# Patient Record
Sex: Female | Born: 1990 | Race: White | Hispanic: No | Marital: Married | Smoking: Never smoker
Health system: Southern US, Community
[De-identification: ages and names within clinical notes are randomized; demographics above are authoritative.]

## PROBLEM LIST (undated history)

## (undated) DIAGNOSIS — O09299 Supervision of pregnancy with other poor reproductive or obstetric history, unspecified trimester: Secondary | ICD-10-CM

## (undated) DIAGNOSIS — Z889 Allergy status to unspecified drugs, medicaments and biological substances status: Secondary | ICD-10-CM

## (undated) DIAGNOSIS — N200 Calculus of kidney: Secondary | ICD-10-CM

## (undated) DIAGNOSIS — Z9109 Other allergy status, other than to drugs and biological substances: Secondary | ICD-10-CM

## (undated) DIAGNOSIS — J3081 Allergic rhinitis due to animal (cat) (dog) hair and dander: Secondary | ICD-10-CM

## (undated) DIAGNOSIS — K219 Gastro-esophageal reflux disease without esophagitis: Secondary | ICD-10-CM

## (undated) HISTORY — DX: Gastro-esophageal reflux disease without esophagitis: K21.9

## (undated) HISTORY — PX: OTHER SURGICAL HISTORY: SHX169

## (undated) HISTORY — DX: Other allergy status, other than to drugs and biological substances: Z91.09

## (undated) HISTORY — PX: TONSILLECTOMY AND ADENOIDECTOMY: SUR1326

## (undated) HISTORY — DX: Allergy status to unspecified drugs, medicaments and biological substances: Z88.9

## (undated) HISTORY — DX: Allergic rhinitis due to animal (cat) (dog) hair and dander: J30.81

---

## 2012-05-07 ENCOUNTER — Ambulatory Visit: Payer: Self-pay | Admitting: Women's Health

## 2012-05-07 ENCOUNTER — Encounter: Payer: Self-pay | Admitting: Women's Health

## 2012-05-07 ENCOUNTER — Ambulatory Visit (INDEPENDENT_AMBULATORY_CARE_PROVIDER_SITE_OTHER): Payer: BC Managed Care – PPO | Admitting: Women's Health

## 2012-05-07 VITALS — BP 100/62 | Ht 61.5 in | Wt 105.0 lb

## 2012-05-07 DIAGNOSIS — Z309 Encounter for contraceptive management, unspecified: Secondary | ICD-10-CM

## 2012-05-07 DIAGNOSIS — N949 Unspecified condition associated with female genital organs and menstrual cycle: Secondary | ICD-10-CM

## 2012-05-07 DIAGNOSIS — K219 Gastro-esophageal reflux disease without esophagitis: Secondary | ICD-10-CM

## 2012-05-07 DIAGNOSIS — IMO0001 Reserved for inherently not codable concepts without codable children: Secondary | ICD-10-CM | POA: Insufficient documentation

## 2012-05-07 DIAGNOSIS — N938 Other specified abnormal uterine and vaginal bleeding: Secondary | ICD-10-CM

## 2012-05-07 LAB — PREGNANCY, URINE: Preg Test, Ur: NEGATIVE

## 2012-05-07 MED ORDER — ETONOGESTREL-ETHINYL ESTRADIOL 0.12-0.015 MG/24HR VA RING: VAGINAL_RING | VAGINAL | Status: DC

## 2012-05-07 NOTE — Progress Notes (Signed)
Patient ID: Matha Masse, female   DOB: Jul 12, 1991, 20 y.o.   MRN: [id number] Presents as a new patient with a problem. History of regular monthly 4-5 day cycles. First dose of Depo-Provera in January and had bleeding/spotting daily since February 20. Did not receive the second injection of depo in April since spotting. Primary care physician instructed her to wait until cycle regulated and then start back on pills. Had nausea on pills, has used condoms since April. Same partner for years. U PT negative. Gardasil series completed. Records reviewed, normal Pap July 2012 prescribed Ortho Tri-Cyclen, stopped due to nausea, tried depo in Jan, 2013.  Exam: Heart regular rate and rhythm, thyroid not enlarged no palpable nodules. Lungs clear throughout. Abdomen soft, nontender, external genitalia within normal limits, speculum exam scant brown discharge wet prep negative, GC/Chlamydia culture taken and is pending. Bimanual no CMT or adnexal fullness or tenderness.  DUB probably from Depo-Provera  Plan:Nuva ring placed with instructions, prescription and samples given condoms especially first month and for infection control. Slight risk for blood clots strokes reviewed, instructed to call if continues with spotting or if problems. Reviewed if spotting would continue will do further blood work.

## 2012-05-07 NOTE — Patient Instructions (Signed)
Health Maintenance, 18- to 21-Year-Old SCHOOL PERFORMANCE After high school completion, the Tahje Borawski adult may be attending college, technical or vocational school, or entering the military or the work force. SOCIAL AND EMOTIONAL DEVELOPMENT The Aveen Stansel adult establishes adult relationships and explores sexual identity. Niki Cosman adults may be living at home or in a college dorm or apartment. Increasing independence is important with Savina Olshefski adults. Throughout adolescence, teens should assume responsibility of their own health care. IMMUNIZATIONS Most Kiele Heavrin adults should be fully vaccinated. A booster dose of Tdap (tetanus, diphtheria, and pertussis, or "whooping cough"), a dose of meningococcal vaccine to protect against a certain type of bacterial meningitis, hepatitis A, human papillomarvirus (HPV), chickenpox, or measles vaccines may be indicated, if not given at an earlier age. Annual influenza or "flu" vaccination should be considered during flu season.  TESTING Annual screening for vision and hearing problems is recommended. Vision should be screened objectively at least once between 18 and 21 years of age. The Rook Maue adult may be screened for anemia or tuberculosis. Trevante Tennell adults should have a blood test to check for high cholesterol during this time period. Sherise Geerdes adults should be screened for use of alcohol and drugs. If the Delaine Canter adult is sexually active, screening for sexually transmitted infections, pregnancy, or HIV may be performed. Screening for cervical cancer should be performed within 3 years of beginning sexual activity. NUTRITION AND ORAL HEALTH  Adequate calcium intake is important. Consume 3 servings of low-fat milk and dairy products daily. For those who do not drink milk or consume dairy products, calcium enriched foods, such as juice, bread, or cereal, dark, leafy greens, or canned fish are alternate sources of calcium.   Drink plenty of water. Limit fruit juice to 8 to 12 ounces per day.  Avoid sugary beverages or sodas.   Discourage skipping meals, especially breakfast. Teens should eat a good variety of vegetables and fruits, as well as lean meats.   Avoid high fat, high salt, and high sugar foods, such as candy, chips, and cookies.   Encourage Sondra Blixt adults to participate in meal planning and preparation.   Eat meals together as a family whenever possible. Encourage conversation at mealtime.   Limit fast food choices and eating out at restaurants.   Brush teeth twice a day and floss.   Schedule dental exams twice a year.  SLEEP Regular sleep habits are important. PHYSICAL, SOCIAL, AND EMOTIONAL DEVELOPMENT  One hour of regular physical activity daily is recommended. Continue to participate in sports.   Encourage Andrian Urbach adults to develop their own interests and consider community service or volunteerism.   Provide guidance to the Kaniesha Barile adult in making decisions about college and work plans.   Make sure that Chrys Landgrebe adults know that they should never be in a situation that makes them uncomfortable, and they should tell partners if they do not want to engage in sexual activity.   Talk to the Alexsis Kathman adult about body image. Eating disorders may be noted at this time. Abram Sax adults may also be concerned about being overweight. Monitor the Orland Visconti adult for weight gain or loss.   Mood disturbances, depression, anxiety, alcoholism, or attention problems may be noted in Divine Imber adults. Talk to the caregiver if there are concerns about mental illness.   Negotiate limit setting and independent decision making.   Encourage the Mattilyn Crites adult to handle conflict without physical violence.   Avoid loud noises which may impair hearing.   Limit television and computer time to 2 hours per day.   Individuals who engage in excessive sedentary activity are more likely to become overweight.  RISK BEHAVIORS  Sexually active Mabel Roll adults need to take precautions against pregnancy and sexually  transmitted infections. Talk to Tiger Spieker adults about contraception.   Provide a tobacco-free and drug-free environment for the Hetal Proano adult. Talk to the Tosh Glaze adult about drug, tobacco, and alcohol use among friends or at friends' homes. Make sure the Dilana Mcphie adult knows that smoking tobacco or marijuana and taking drugs have health consequences and may impact brain development.   Teach the Athene Schuhmacher adult about appropriate use of over-the-counter or prescription medicines.   Establish guidelines for driving and for riding with friends.   Talk to Ruby Dilone adults about the risks of drinking and driving or boating. Encourage the Della Scrivener adult to call you if he or she or friends have been drinking or using drugs.   Remind Ladasia Sircy adults to wear seat belts at all times in cars and life vests in boats.   Rodger Giangregorio adults should always wear a properly fitted helmet when they are riding a bicycle.   Use caution with all-terrain vehicles (ATVs) or other motorized vehicles.   Do not keep handguns in the home. (If you do, the gun and ammunition should be locked separately and out of the Kewan Mcnease adult's access.)   Equip your home with smoke detectors and change the batteries regularly. Make sure all family members know the fire escape plans for your home.   Teach Perry Brucato adults not to swim alone and not to dive in shallow water.   All individuals should wear sunscreen that protects against UVA and UVB light with at least a sun protection factor (SPF) of 30 when out in the sun. This minimizes sun burning.  WHAT'S NEXT? Yasaman Kolek adults should visit their pediatrician or family physician yearly. By Isobel Eisenhuth adulthood, health care should be transitioned to a family physician or internal medicine specialist. Sexually active females may want to begin annual physical exams with a gynecologist. Document Released: 02/09/2007 Document Revised: 11/03/2011 Document Reviewed: 03/01/2007 ExitCare Patient Information 2012 [employer]. 

## 2012-05-08 LAB — GC/CHLAMYDIA PROBE AMP, GENITAL
Chlamydia, DNA Probe: NEGATIVE
GC Probe Amp, Genital: NEGATIVE

## 2012-07-23 ENCOUNTER — Telehealth: Payer: Self-pay | Admitting: *Deleted

## 2012-07-23 DIAGNOSIS — IMO0001 Reserved for inherently not codable concepts without codable children: Secondary | ICD-10-CM

## 2012-07-23 MED ORDER — ETONOGESTREL-ETHINYL ESTRADIOL 0.12-0.015 MG/24HR VA RING: VAGINAL_RING | VAGINAL | Status: DC

## 2012-07-23 NOTE — Telephone Encounter (Signed)
Pt calling requesting birth control rx be resent to pharmacy, rx sent.

## 2013-01-15 ENCOUNTER — Telehealth: Payer: Self-pay | Admitting: *Deleted

## 2013-01-15 NOTE — Telephone Encounter (Signed)
PT DROPPED HER NUVARING DOWN THE TOILET, CALL AND LEFT MESSAGE ON PT VOICEMAIL OKAY TO PICK UP SAMPLE.

## 2013-07-31 ENCOUNTER — Other Ambulatory Visit: Payer: Self-pay

## 2013-07-31 DIAGNOSIS — IMO0001 Reserved for inherently not codable concepts without codable children: Secondary | ICD-10-CM

## 2013-07-31 MED ORDER — ETONOGESTREL-ETHINYL ESTRADIOL 0.12-0.015 MG/24HR VA RING: VAGINAL_RING | VAGINAL | Status: DC

## 2013-09-10 ENCOUNTER — Other Ambulatory Visit: Payer: Self-pay

## 2013-09-10 ENCOUNTER — Other Ambulatory Visit: Payer: Self-pay | Admitting: Women's Health

## 2013-09-10 DIAGNOSIS — IMO0001 Reserved for inherently not codable concepts without codable children: Secondary | ICD-10-CM

## 2013-09-12 ENCOUNTER — Encounter: Payer: Self-pay | Admitting: Women's Health

## 2013-09-12 ENCOUNTER — Ambulatory Visit (INDEPENDENT_AMBULATORY_CARE_PROVIDER_SITE_OTHER): Payer: BC Managed Care – PPO | Admitting: Women's Health

## 2013-09-12 ENCOUNTER — Other Ambulatory Visit (HOSPITAL_COMMUNITY)
Admission: RE | Admit: 2013-09-12 | Discharge: 2013-09-12 | Disposition: A | Discharge status: Home / Self Care | Payer: BC Managed Care – PPO | Source: Ambulatory Visit | Attending: Gynecology | Admitting: Gynecology

## 2013-09-12 VITALS — BP 105/62 | Ht 62.0 in | Wt 107.0 lb

## 2013-09-12 DIAGNOSIS — Z01419 Encounter for gynecological examination (general) (routine) without abnormal findings: Secondary | ICD-10-CM

## 2013-09-12 DIAGNOSIS — Z309 Encounter for contraceptive management, unspecified: Secondary | ICD-10-CM

## 2013-09-12 DIAGNOSIS — IMO0001 Reserved for inherently not codable concepts without codable children: Secondary | ICD-10-CM

## 2013-09-12 MED ORDER — ETONOGESTREL-ETHINYL ESTRADIOL 0.12-0.015 MG/24HR VA RING: VAGINAL_RING | VAGINAL | Status: DC

## 2013-09-12 NOTE — Patient Instructions (Signed)
Health Maintenance, 18- to 21-Year-Old SCHOOL PERFORMANCE After high school completion, the young adult may be attending college, technical or vocational school, or entering the military or the work force. SOCIAL AND EMOTIONAL DEVELOPMENT The young adult establishes adult relationships and explores sexual identity. Young adults may be living at home or in a college dorm or apartment. Increasing independence is important with young adults. Throughout adolescence, teens should assume responsibility of their own health care. IMMUNIZATIONS Most young adults should be fully vaccinated. A booster dose of Tdap (tetanus, diphtheria, and pertussis, or "whooping cough"), a dose of meningococcal vaccine to protect against a certain type of bacterial meningitis, hepatitis A, human papillomarvirus (HPV), chickenpox, or measles vaccines may be indicated, if not given at an earlier age. Annual influenza or "flu" vaccination should be considered during flu season.  TESTING Annual screening for vision and hearing problems is recommended. Vision should be screened objectively at least once between 18 and 21 years of age. The young adult may be screened for anemia or tuberculosis. Young adults should have a blood test to check for high cholesterol during this time period. Young adults should be screened for use of alcohol and drugs. If the young adult is sexually active, screening for sexually transmitted infections, pregnancy, or HIV may be performed. Screening for cervical cancer should be performed within 3 years of beginning sexual activity. NUTRITION AND ORAL HEALTH  Adequate calcium intake is important. Consume 3 servings of low-fat milk and dairy products daily. For those who do not drink milk or consume dairy products, calcium enriched foods, such as juice, bread, or cereal, dark, leafy greens, or canned fish are alternate sources of calcium.  Drink plenty of water. Limit fruit juice to 8 to 12 ounces per day.  Avoid sugary beverages or sodas.  Discourage skipping meals, especially breakfast. Teens should eat a good variety of vegetables and fruits, as well as lean meats.  Avoid high fat, high salt, and high sugar foods, such as candy, chips, and cookies.  Encourage young adults to participate in meal planning and preparation.  Eat meals together as a family whenever possible. Encourage conversation at mealtime.  Limit fast food choices and eating out at restaurants.  Brush teeth twice a day and floss.  Schedule dental exams twice a year. SLEEP Regular sleep habits are important. PHYSICAL, SOCIAL, AND EMOTIONAL DEVELOPMENT  One hour of regular physical activity daily is recommended. Continue to participate in sports.  Encourage young adults to develop their own interests and consider community service or volunteerism.  Provide guidance to the young adult in making decisions about college and work plans.  Make sure that young adults know that they should never be in a situation that makes them uncomfortable, and they should tell partners if they do not want to engage in sexual activity.  Talk to the young adult about body image. Eating disorders may be noted at this time. Young adults may also be concerned about being overweight. Monitor the young adult for weight gain or loss.  Mood disturbances, depression, anxiety, alcoholism, or attention problems may be noted in young adults. Talk to the caregiver if there are concerns about mental illness.  Negotiate limit setting and independent decision making.  Encourage the young adult to handle conflict without physical violence.  Avoid loud noises which may impair hearing.  Limit television and computer time to 2 hours per day. Individuals who engage in excessive sedentary activity are more likely to become overweight. RISK BEHAVIORS  Sexually active   young adults need to take precautions against pregnancy and sexually transmitted  infections. Talk to young adults about contraception.  Provide a tobacco-free and drug-free environment for the young adult. Talk to the young adult about drug, tobacco, and alcohol use among friends or at friends' homes. Make sure the young adult knows that smoking tobacco or marijuana and taking drugs have health consequences and may impact brain development.  Teach the young adult about appropriate use of over-the-counter or prescription medicines.  Establish guidelines for driving and for riding with friends.  Talk to young adults about the risks of drinking and driving or boating. Encourage the young adult to call you if he or she or friends have been drinking or using drugs.  Remind young adults to wear seat belts at all times in cars and life vests in boats.  Young adults should always wear a properly fitted helmet when they are riding a bicycle.  Use caution with all-terrain vehicles (ATVs) or other motorized vehicles.  Do not keep handguns in the home. (If you do, the gun and ammunition should be locked separately and out of the young adult's access.)  Equip your home with smoke detectors and change the batteries regularly. Make sure all family members know the fire escape plans for your home.  Teach young adults not to swim alone and not to dive in shallow water.  All individuals should wear sunscreen that protects against UVA and UVB light with at least a sun protection factor (SPF) of 30 when out in the sun. This minimizes sun burning. WHAT'S NEXT? Young adults should visit their pediatrician or family physician yearly. By young adulthood, health care should be transitioned to a family physician or internal medicine specialist. Sexually active females may want to begin annual physical exams with a gynecologist. Document Released: 02/09/2007 Document Revised: 02/06/2012 Document Reviewed: 03/01/2007 ExitCare Patient Information 2014 [employer].  

## 2013-09-12 NOTE — Progress Notes (Signed)
Barbara Pham 1991-03-24 [id number]    History:    The patient presents for annual exam.  Monthly cycle on NuvaRing. Completed gardasil series. Same partner/negative screening 2013. Normal Pap 2012.   Past medical history, past surgical history, family history and social history were all reviewed and documented in the EPIC chart. 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 Marriage planned for April.   ROS:  A  ROS was performed and pertinent positives and negatives are included in the history.  Exam:  Filed Vitals:   09/12/13 1055  BP: 105/62    General appearance:  Normal Head/Neck:  Normal, without cervical or supraclavicular adenopathy. Thyroid:  Symmetrical, normal in size, without palpable masses or nodularity. Respiratory  Effort:  Normal  Auscultation:  Clear without wheezing or rhonchi Cardiovascular  Auscultation:  Regular rate, without rubs, murmurs or gallops  Edema/varicosities:  Not grossly evident Abdominal  Soft,nontender, without masses, guarding or rebound.  Liver/spleen:  No organomegaly noted  Hernia:  None appreciated  Skin  Inspection:  Grossly normal  Palpation:  Grossly normal Neurologic/psychiatric  Orientation:  Normal with appropriate conversation.  Mood/affect:  Normal  Genitourinary    Breasts: Examined lying and sitting.     Right: Without masses, retractions, discharge or axillary adenopathy.     Left: Without masses, retractions, discharge or axillary adenopathy.   Inguinal/mons:  Normal without inguinal adenopathy  External genitalia:  Normal  BUS/Urethra/Skene's glands:  Normal  Bladder:  Normal  Vagina:  Normal  Cervix:  Normal  Uterus:   normal in size, shape and contour.  Midline and mobile  Adnexa/parametria:     Rt: Without masses or tenderness.   Lt: Without masses or tenderness.  Anus and perineum: Normal    Assessment/Plan:  22 y.o. SWF G0 for annual exam.    Normal GYN exam on  NuvaRing  Plan: NuvaRing prescription, proper use, slight risk for blood clots and strokes reviewed. SBE's, continue regular exercise, MVI daily encouraged. Reviewed importance of decreasing/no tanning. CBC, UA, Pap. New screening guidelines reviewed.   [name] [facility], 11:33 AM 09/12/2013

## 2013-09-12 NOTE — Addendum Note (Signed)
Addended by: Richardson Chiquito on: 09/12/2013 02:13 PM   Modules accepted: Orders

## 2013-09-13 LAB — URINALYSIS W MICROSCOPIC + REFLEX CULTURE
Bacteria, UA: NONE SEEN
Bilirubin Urine: NEGATIVE
Casts: NONE SEEN
Crystals: NONE SEEN
Ketones, ur: NEGATIVE mg/dL
Specific Gravity, Urine: 1.007 (ref 1.005–1.030)
Squamous Epithelial / LPF: NONE SEEN
pH: 7 (ref 5.0–8.0)

## 2014-09-30 ENCOUNTER — Other Ambulatory Visit: Payer: Self-pay

## 2014-09-30 MED ORDER — ETONOGESTREL-ETHINYL ESTRADIOL 0.12-0.015 MG/24HR VA RING: VAGINAL_RING | VAGINAL | Status: DC

## 2014-10-08 ENCOUNTER — Ambulatory Visit (INDEPENDENT_AMBULATORY_CARE_PROVIDER_SITE_OTHER): Payer: BC Managed Care – PPO | Admitting: Women's Health

## 2014-10-08 ENCOUNTER — Encounter: Payer: Self-pay | Admitting: Women's Health

## 2014-10-08 VITALS — BP 110/80 | Ht 61.0 in | Wt 116.0 lb

## 2014-10-08 DIAGNOSIS — Z3040 Encounter for surveillance of contraceptives, unspecified: Secondary | ICD-10-CM

## 2014-10-08 DIAGNOSIS — Z01419 Encounter for gynecological examination (general) (routine) without abnormal findings: Secondary | ICD-10-CM

## 2014-10-08 LAB — CBC WITH DIFFERENTIAL/PLATELET
BASOS PCT: 1 % (ref 0–1)
Basophils Absolute: 0.1 10*3/uL (ref 0.0–0.1)
Eosinophils Absolute: 0.1 10*3/uL (ref 0.0–0.7)
Eosinophils Relative: 2 % (ref 0–5)
HCT: 38.6 % (ref 36.0–46.0)
Hemoglobin: 13 g/dL (ref 12.0–15.0)
Lymphocytes Relative: 50 % — ABNORMAL HIGH (ref 12–46)
Lymphs Abs: 3.6 10*3/uL (ref 0.7–4.0)
MCH: 30.4 pg (ref 26.0–34.0)
MCHC: 33.7 g/dL (ref 30.0–36.0)
MCV: 90.4 fL (ref 78.0–100.0)
Monocytes Absolute: 0.5 10*3/uL (ref 0.1–1.0)
Monocytes Relative: 7 % (ref 3–12)
NEUTROS PCT: 40 % — AB (ref 43–77)
Neutro Abs: 2.8 10*3/uL (ref 1.7–7.7)
PLATELETS: 259 10*3/uL (ref 150–400)
RBC: 4.27 MIL/uL (ref 3.87–5.11)
RDW: 13 % (ref 11.5–15.5)
WBC: 7.1 10*3/uL (ref 4.0–10.5)

## 2014-10-08 MED ORDER — ETONOGESTREL-ETHINYL ESTRADIOL 0.12-0.015 MG/24HR VA RING: VAGINAL_RING | VAGINAL | Status: DC

## 2014-10-08 NOTE — Patient Instructions (Signed)

## 2014-10-08 NOTE — Progress Notes (Signed)
Barbara FindersDarcy L Pham 02/19/1991 [id number]    History:    Presents for annual exam.  Regular monthly cycle on NuvaRing. Gardasil series completed. normal Pap.  Past medical history, past surgical history, family history and social history were all reviewed and documented in the EPIC chart. 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  ROS:  A  12 point ROS was performed and pertinent positives and negatives are included.  Exam:  Filed Vitals:   10/08/14 1415  BP: 110/80    General appearance:  Normal Thyroid:  Symmetrical, normal in size, without palpable masses or nodularity. Respiratory  Auscultation:  Clear without wheezing or rhonchi Cardiovascular  Auscultation:  Regular rate, without rubs, murmurs or gallops  Edema/varicosities:  Not grossly evident Abdominal  Soft,nontender, without masses, guarding or rebound.  Liver/spleen:  No organomegaly noted  Hernia:  None appreciated  Skin  Inspection:  Grossly normal   Breasts: Examined lying and sitting.     Right: Without masses, retractions, discharge or axillary adenopathy.     Left: Without masses, retractions, discharge or axillary adenopathy. Gentitourinary   Inguinal/mons:  Normal without inguinal adenopathy  External genitalia:  Normal  BUS/Urethra/Skene's glands:  Normal  Vagina:  Normal  Cervix:  Normal  Uterus:   normal in size, shape and contour.  Midline and mobile  Adnexa/parametria:     Rt: Without masses or tenderness.   Lt: Without masses or tenderness.  Anus and perineum: Normal    Assessment/Plan:  23 y.o. MWF G0 for annual exam with no complaints.  NuvaRing  Plan: NuvaRing prescription, proper use, slight risk for blood clots and strokes reviewed. SBE's, regular exercise, calcium rich diet, MVI daily encouraged. CBC, rubella titer UA, Pap normal 2014, new screening guidelines reviewed.    Harrington ChallengerYOUNG,NANCY J Kingman Regional Medical CenterWHNP, 5:33 PM 10/08/2014

## 2014-10-09 LAB — URINALYSIS W MICROSCOPIC + REFLEX CULTURE
BACTERIA UA: NONE SEEN
BILIRUBIN URINE: NEGATIVE
CRYSTALS: NONE SEEN
Casts: NONE SEEN
GLUCOSE, UA: NEGATIVE mg/dL
Hgb urine dipstick: NEGATIVE
KETONES UR: NEGATIVE mg/dL
Leukocytes, UA: NEGATIVE
Nitrite: NEGATIVE
Protein, ur: NEGATIVE mg/dL
SPECIFIC GRAVITY, URINE: 1.005 (ref 1.005–1.030)
SQUAMOUS EPITHELIAL / LPF: NONE SEEN
Urobilinogen, UA: 0.2 mg/dL (ref 0.0–1.0)
pH: 7 (ref 5.0–8.0)

## 2014-10-10 LAB — RUBELLA ANTIBODY, IGM: RUBELLA: 0.37 (ref <0.91)

## 2014-11-11 ENCOUNTER — Telehealth: Payer: Self-pay | Admitting: *Deleted

## 2014-11-11 NOTE — Telephone Encounter (Signed)
Pt called requesting name of place to have Rubella vaccination, I give pt name of health department at [phone]

## 2015-06-29 ENCOUNTER — Telehealth: Payer: Self-pay | Admitting: *Deleted

## 2015-06-29 NOTE — Telephone Encounter (Signed)
Pt states she and her husband trying to conceive asked if OV needed before trying, I told pt no visit needed to call with position UPT and schedule OV. Annual due in Nov.2016

## 2016-01-06 ENCOUNTER — Ambulatory Visit (INDEPENDENT_AMBULATORY_CARE_PROVIDER_SITE_OTHER): Payer: BLUE CROSS/BLUE SHIELD

## 2016-01-06 ENCOUNTER — Encounter: Payer: Self-pay | Admitting: Women's Health

## 2016-01-06 ENCOUNTER — Ambulatory Visit (INDEPENDENT_AMBULATORY_CARE_PROVIDER_SITE_OTHER): Payer: BLUE CROSS/BLUE SHIELD | Admitting: Women's Health

## 2016-01-06 ENCOUNTER — Other Ambulatory Visit: Payer: Self-pay | Admitting: Women's Health

## 2016-01-06 VITALS — BP 128/80 | Ht 61.0 in | Wt 116.0 lb

## 2016-01-06 DIAGNOSIS — N912 Amenorrhea, unspecified: Secondary | ICD-10-CM

## 2016-01-06 DIAGNOSIS — O3680X0 Pregnancy with inconclusive fetal viability, not applicable or unspecified: Secondary | ICD-10-CM

## 2016-01-06 DIAGNOSIS — Z3201 Encounter for pregnancy test, result positive: Secondary | ICD-10-CM | POA: Diagnosis not present

## 2016-01-06 LAB — PREGNANCY, URINE: Preg Test, Ur: POSITIVE — AB

## 2016-01-06 NOTE — Patient Instructions (Signed)
First Trimester of Pregnancy The first trimester of pregnancy is from week 1 until the end of week 12 (months 1 through 3). A week after a sperm fertilizes an egg, the egg will implant on the wall of the uterus. This embryo will begin to develop into a baby. Genes from you and your partner are forming the baby. The female genes determine whether the baby is a boy or a girl. At 6-8 weeks, the eyes and face are formed, and the heartbeat can be seen on ultrasound. At the end of 12 weeks, all the baby's organs are formed.  Now that you are pregnant, you will want to do everything you can to have a healthy baby. Two of the most important things are to get good prenatal care and to follow your health care provider's instructions. Prenatal care is all the medical care you receive before the baby's birth. This care will help prevent, find, and treat any problems during the pregnancy and childbirth. BODY CHANGES Your body goes through many changes during pregnancy. The changes vary from woman to woman.   You may gain or lose a couple of pounds at first.  You may feel sick to your stomach (nauseous) and throw up (vomit). If the vomiting is uncontrollable, call your health care provider.  You may tire easily.  You may develop headaches that can be relieved by medicines approved by your health care provider.  You may urinate more often. Painful urination may mean you have a bladder infection.  You may develop heartburn as a result of your pregnancy.  You may develop constipation because certain hormones are causing the muscles that push waste through your intestines to slow down.  You may develop hemorrhoids or swollen, bulging veins (varicose veins).  Your breasts may begin to grow larger and become tender. Your nipples may stick out more, and the tissue that surrounds them (areola) may become darker.  Your gums may bleed and may be sensitive to brushing and flossing.  Dark spots or blotches (chloasma,  mask of pregnancy) may develop on your face. This will likely fade after the baby is born.  Your menstrual periods will stop.  You may have a loss of appetite.  You may develop cravings for certain kinds of food.  You may have changes in your emotions from day to day, such as being excited to be pregnant or being concerned that something may go wrong with the pregnancy and baby.  You may have more vivid and strange dreams.  You may have changes in your hair. These can include thickening of your hair, rapid growth, and changes in texture. Some women also have hair loss during or after pregnancy, or hair that feels dry or thin. Your hair will most likely return to normal after your baby is born. WHAT TO EXPECT AT YOUR PRENATAL VISITS During a routine prenatal visit:  You will be weighed to make sure you and the baby are growing normally.  Your blood pressure will be taken.  Your abdomen will be measured to track your baby's growth.  The fetal heartbeat will be listened to starting around week 10 or 12 of your pregnancy.  Test results from any previous visits will be discussed. Your health care provider may ask you:  How you are feeling.  If you are feeling the baby move.  If you have had any abnormal symptoms, such as leaking fluid, bleeding, severe headaches, or abdominal cramping.  If you are using any tobacco products,   including cigarettes, chewing tobacco, and electronic cigarettes.  If you have any questions. Other tests that may be performed during your first trimester include:  Blood tests to find your blood type and to check for the presence of any previous infections. They will also be used to check for low iron levels (anemia) and Rh antibodies. Later in the pregnancy, blood tests for diabetes will be done along with other tests if problems develop.  Urine tests to check for infections, diabetes, or protein in the urine.  An ultrasound to confirm the proper growth  and development of the baby.  An amniocentesis to check for possible genetic problems.  Fetal screens for spina bifida and Down syndrome.  You may need other tests to make sure you and the baby are doing well.  HIV (human immunodeficiency virus) testing. Routine prenatal testing includes screening for HIV, unless you choose not to have this test. HOME CARE INSTRUCTIONS  Medicines  Follow your health care provider's instructions regarding medicine use. Specific medicines may be either safe or unsafe to take during pregnancy.  Take your prenatal vitamins as directed.  If you develop constipation, try taking a stool softener if your health care provider approves. Diet  Eat regular, well-balanced meals. Choose a variety of foods, such as meat or vegetable-based protein, fish, milk and low-fat dairy products, vegetables, fruits, and whole grain breads and cereals. Your health care provider will help you determine the amount of weight gain that is right for you.  Avoid raw meat and uncooked cheese. These carry germs that can cause birth defects in the baby.  Eating four or five small meals rather than three large meals a day may help relieve nausea and vomiting. If you start to feel nauseous, eating a few soda crackers can be helpful. Drinking liquids between meals instead of during meals also seems to help nausea and vomiting.  If you develop constipation, eat more high-fiber foods, such as fresh vegetables or fruit and whole grains. Drink enough fluids to keep your urine clear or pale yellow. Activity and Exercise  Exercise only as directed by your health care provider. Exercising will help you:  Control your weight.  Stay in shape.  Be prepared for labor and delivery.  Experiencing pain or cramping in the lower abdomen or low back is a good sign that you should stop exercising. Check with your health care provider before continuing normal exercises.  Try to avoid standing for long  periods of time. Move your legs often if you must stand in one place for a long time.  Avoid heavy lifting.  Wear low-heeled shoes, and practice good posture.  You may continue to have sex unless your health care provider directs you otherwise. Relief of Pain or Discomfort  Wear a good support bra for breast tenderness.   Take warm sitz baths to soothe any pain or discomfort caused by hemorrhoids. Use hemorrhoid cream if your health care provider approves.   Rest with your legs elevated if you have leg cramps or low back pain.  If you develop varicose veins in your legs, wear support hose. Elevate your feet for 15 minutes, 3-4 times a day. Limit salt in your diet. Prenatal Care  Schedule your prenatal visits by the twelfth week of pregnancy. They are usually scheduled monthly at first, then more often in the last 2 months before delivery.  Write down your questions. Take them to your prenatal visits.  Keep all your prenatal visits as directed by your   health care provider. Safety  Wear your seat belt at all times when driving.  Make a list of emergency phone numbers, including numbers for family, friends, the hospital, and police and fire departments. General Tips  Ask your health care provider for a referral to a local prenatal education class. Begin classes no later than at the beginning of month 6 of your pregnancy.  Ask for help if you have counseling or nutritional needs during pregnancy. Your health care provider can offer advice or refer you to specialists for help with various needs.  Do not use hot tubs, steam rooms, or saunas.  Do not douche or use tampons or scented sanitary pads.  Do not cross your legs for long periods of time.  Avoid cat litter boxes and soil used by cats. These carry germs that can cause birth defects in the baby and possibly loss of the fetus by miscarriage or stillbirth.  Avoid all smoking, herbs, alcohol, and medicines not prescribed by  your health care provider. Chemicals in these affect the formation and growth of the baby.  Do not use any tobacco products, including cigarettes, chewing tobacco, and electronic cigarettes. If you need help quitting, ask your health care provider. You may receive counseling support and other resources to help you quit.  Schedule a dentist appointment. At home, brush your teeth with a soft toothbrush and be gentle when you floss. SEEK MEDICAL CARE IF:   You have dizziness.  You have mild pelvic cramps, pelvic pressure, or nagging pain in the abdominal area.  You have persistent nausea, vomiting, or diarrhea.  You have a bad smelling vaginal discharge.  You have pain with urination.  You notice increased swelling in your face, hands, legs, or ankles. SEEK IMMEDIATE MEDICAL CARE IF:   You have a fever.  You are leaking fluid from your vagina.  You have spotting or bleeding from your vagina.  You have severe abdominal cramping or pain.  You have rapid weight gain or loss.  You vomit blood or material that looks like coffee grounds.  You are exposed to German measles and have never had them.  You are exposed to fifth disease or chickenpox.  You develop a severe headache.  You have shortness of breath.  You have any kind of trauma, such as from a fall or a car accident.   This information is not intended to replace advice given to you by your health care provider. Make sure you discuss any questions you have with your health care provider.   Document Released: 11/08/2001 Document Revised: 12/05/2014 Document Reviewed: 09/24/2013 Elsevier Interactive Patient Education 2016 Elsevier Inc.  

## 2016-01-06 NOTE — Progress Notes (Signed)
Patient ID: Barbara Pham, female   DOB: 1991-04-03, 25 y.o.   MRN: [id number] Presents with positive home UPT. LMP 11/19/2015 normal cycle. Has been off OCs for 6 months, pleased with pregnancy. Having some issues with nausea and vomiting. Denies any abdominal pain, discharge, urinary symptoms, bleeding or spotting. History of rubella nonimmune has had vaccine since.  Exam: Appears well. UPT positive. Ultrasound: T/V anteverted uterus with living IUP seen in fundus size equal dates by EGA and LMP. CRL 6 weeks 6 days fetal pole and FHM seen at 135 bpm. Cervix long and closed, noted prominent vascularity at cervix 13 x 15 x 14 mm. Right ovary corpus luteal cyst 16 x 23 mm. Left ovary normal. Negative cul-de-sac.  Early pregnancy  Plan: Continue prenatal vitamin daily. Unisom and B6 over-the-counter for nausea, nausea prevention discussed. Schedule new OB appointment. A copy of ultrasound and  Pap smear given. Encouraged pelvic rest, congratulations given.

## 2016-01-07 ENCOUNTER — Ambulatory Visit: Payer: Self-pay | Admitting: Women's Health

## 2016-01-11 ENCOUNTER — Other Ambulatory Visit: Payer: Self-pay | Admitting: *Deleted

## 2016-01-11 MED ORDER — DOXYLAMINE-PYRIDOXINE 10-10 MG PO TBEC: DELAYED_RELEASE_TABLET | ORAL | Status: DC

## 2016-01-11 NOTE — Telephone Encounter (Signed)
Please call and review it is a safe prescription is a combination of Unisom and B6, sometimes  expensive, if it is too expensive you can try over-the-counter B6 with Unisom. Morning sickness will pass-thank heavens, but terrible while it is occurring. Please escribe rx

## 2016-01-11 NOTE — Telephone Encounter (Signed)
Pt called asking if safe to take Diclegis medication for nausea, pt currently [redacted] weeks pregnant. If so pt would like Rx for this, c/o morning sickness daily.  Please advise

## 2016-01-11 NOTE — Telephone Encounter (Signed)
Barbara Pham pt said she tried Diclegis (her friend who is pregnant as well gave her 2 tablet to take at bedtime and done well) pt would like Rx sent. Will check to see if the above directions are correct as I had to type directions and do you what refills?

## 2016-06-23 ENCOUNTER — Encounter (HOSPITAL_COMMUNITY): Payer: Self-pay

## 2016-06-23 ENCOUNTER — Inpatient Hospital Stay (HOSPITAL_COMMUNITY)
Admission: AD | Admit: 2016-06-23 | Discharge: 2016-06-26 | DRG: 781 | Disposition: A | Discharge status: Home / Self Care | Payer: BLUE CROSS/BLUE SHIELD | Source: Ambulatory Visit | Attending: Obstetrics & Gynecology | Admitting: Obstetrics & Gynecology

## 2016-06-23 DIAGNOSIS — O99613 Diseases of the digestive system complicating pregnancy, third trimester: Secondary | ICD-10-CM | POA: Diagnosis present

## 2016-06-23 DIAGNOSIS — K219 Gastro-esophageal reflux disease without esophagitis: Secondary | ICD-10-CM | POA: Diagnosis present

## 2016-06-23 DIAGNOSIS — Z3A31 31 weeks gestation of pregnancy: Secondary | ICD-10-CM

## 2016-06-23 DIAGNOSIS — O1493 Unspecified pre-eclampsia, third trimester: Secondary | ICD-10-CM | POA: Diagnosis present

## 2016-06-23 DIAGNOSIS — O1490 Unspecified pre-eclampsia, unspecified trimester: Secondary | ICD-10-CM

## 2016-06-23 LAB — TYPE AND SCREEN
ABO/RH(D): O POS
Antibody Screen: NEGATIVE

## 2016-06-23 MED ORDER — MAGNESIUM SULFATE 50 % IJ SOLN
2.0000 g/h | INTRAVENOUS | Status: DC
  Administered 2016-06-24: 2 g/h via INTRAVENOUS
  Filled 2016-06-23 (×2): qty 80

## 2016-06-23 MED ORDER — BETAMETHASONE SOD PHOS & ACET 6 (3-3) MG/ML IJ SUSP
12.0000 mg | INTRAMUSCULAR | Status: AC
  Administered 2016-06-23 – 2016-06-24 (×2): 12 mg via INTRAMUSCULAR
  Filled 2016-06-23 (×2): qty 2

## 2016-06-23 MED ORDER — FAMOTIDINE 20 MG PO TABS
20.0000 mg | ORAL_TABLET | Freq: Every day | ORAL | Status: DC
  Administered 2016-06-23 – 2016-06-26 (×4): 20 mg via ORAL
  Filled 2016-06-23 (×4): qty 1

## 2016-06-23 MED ORDER — CALCIUM CARBONATE ANTACID 500 MG PO CHEW: 2.0000 | CHEWABLE_TABLET | ORAL | Status: DC | PRN

## 2016-06-23 MED ORDER — DOCUSATE SODIUM 100 MG PO CAPS
100.0000 mg | ORAL_CAPSULE | Freq: Every day | ORAL | Status: DC
  Administered 2016-06-25 – 2016-06-26 (×2): 100 mg via ORAL
  Filled 2016-06-23 (×3): qty 1

## 2016-06-23 MED ORDER — PRENATAL MULTIVITAMIN CH
1.0000 | ORAL_TABLET | Freq: Every day | ORAL | Status: DC
  Administered 2016-06-24 – 2016-06-25 (×2): 1 via ORAL
  Filled 2016-06-23 (×3): qty 1

## 2016-06-23 MED ORDER — ACETAMINOPHEN 325 MG PO TABS
650.0000 mg | ORAL_TABLET | ORAL | Status: DC | PRN
  Administered 2016-06-24 (×2): 650 mg via ORAL
  Filled 2016-06-23 (×2): qty 2

## 2016-06-23 MED ORDER — MAGNESIUM SULFATE BOLUS VIA INFUSION
4.0000 g | Freq: Once | INTRAVENOUS | Status: AC
  Administered 2016-06-23: 4 g via INTRAVENOUS
  Filled 2016-06-23: qty 500

## 2016-06-23 MED ORDER — LACTATED RINGERS IV SOLN
INTRAVENOUS | Status: DC
  Administered 2016-06-23 – 2016-06-24 (×3): via INTRAVENOUS

## 2016-06-23 NOTE — H&P (Signed)
  OB ADMISSION/ HISTORY & PHYSICAL:  Admission Date: 06/23/2016  5:00 PM  Admit Diagnosis: 31 weeks / preeclampsia / proteiuria  Barbara Pham is a 25 y.o. female presenting for diagnosis for preeclampsia.  Prenatal History: G1P0   EDC : 08/25/2016, Date entered prior to episode creation  Prenatal care at [facility]  Primary Ob Provider: Arita Miss CNM Prenatal course uncomplicated until visit this week with slight increase in baseline BP, proteinuria, and drop in fetal growth and AFI.   Prenatal Labs: ABO, Rh:  O + Antibody:  Negative Rubella:   Immune RPR:   NR HBsAg:   Negative HIV:   NR GTT: normal GBS:   pending   SONO today: vtx / AFI 6.5 / Growth 20% with AC at 0% / normal doppler studies  24 hour urine results - pending today  Rising uric acid Protein /creatnine ratio:  Normal LE  Medical / Surgical History :  Past medical history:  Past Medical History:  Diagnosis Date  . Acid reflux   . Cat allergies   . Multiple allergies    dust and pollen  . Pollen allergies      Past surgical history:  Past Surgical History:  Procedure Laterality Date  . feet surgery     had to stretch bones due to growth plates closing too soon  . TONSILLECTOMY AND ADENOIDECTOMY      Family History: History reviewed. No pertinent family history.   Social History:  reports that she has never smoked. She has never used smokeless tobacco. She reports that she drinks alcohol. She reports that she does not use drugs.   Allergies: Review of patient's allergies indicates no known allergies.    Current Medications at time of admission:  Prior to Admission medications   Medication Sig Start Date End Date Taking? Authorizing Provider  Omeprazole Magnesium ([employer] OMEPRAZOLE) 20.6 (20 Base) MG CPDR Take 1 tablet by mouth daily.   Yes Historical Provider, MD  Prenatal Vit-Fe Fumarate-FA (PRENATAL MULTIVITAMIN) TABS tablet Take 1 tablet by mouth daily at 12 noon.   Yes  Historical Provider, MD  Wheat Dextrin-Calcium ([employer] EASY FIBER/CALCIUM PO) Take 1 tablet by mouth daily.   Yes Historical Provider, MD  Doxylamine-Pyridoxine (DICLEGIS) 10-10 MG TBEC Take two tablets by mouth at bedtime as needed for morning sickness Patient not taking: Reported on 06/23/2016 01/11/16   [name], NP   Review of Systems: Active FM Occasional headache / no vision changes / no epigastric pain  Physical Exam:  VS: Blood pressure (!) 136/97, pulse 79, temperature 98.8 F (37.1 C), temperature source Oral, resp. rate 16, height 5\' 1"  (1.549 m), weight 59 kg (130 lb), last menstrual period 11/19/2015.  General: alert and oriented, appears mildly anxious Heart: RRR Lungs: Clear lung fields Abdomen: Gravid, soft and non-tender, non-distended / uterus: gravid  Extremities: trace dependent edema  Genitalia / VE:  deferred  FHR: baseline rate 120-130's / variability moderate / accelerations + / no decelerations TOCO: no ctx  Assessment: [redacted] weeks gestation Preeclampsia proteinuria FHR category 1   Plan:  Dr Seymour Bars consulted - admit and plan of care MD to follow due to preeclampsia  Admit BMZ course Magnesium for possible PTB - neuro prophylaxis and preeclampsia  Agree with above note.  [name] MD  Marlinda Mike CNM, MSN, [facility] 06/23/2016, 6:43 PM

## 2016-06-24 LAB — COMPREHENSIVE METABOLIC PANEL
ALT: 12 U/L — ABNORMAL LOW (ref 14–54)
AST: 16 U/L (ref 15–41)
Albumin: 2.9 g/dL — ABNORMAL LOW (ref 3.5–5.0)
Alkaline Phosphatase: 76 U/L (ref 38–126)
Anion gap: 8 (ref 5–15)
BUN: 13 mg/dL (ref 6–20)
CO2: 21 mmol/L — ABNORMAL LOW (ref 22–32)
Calcium: 6.8 mg/dL — ABNORMAL LOW (ref 8.9–10.3)
Chloride: 102 mmol/L (ref 101–111)
Creatinine, Ser: 0.64 mg/dL (ref 0.44–1.00)
GFR calc Af Amer: >60 mL/min (ref >60)
GFR calc non Af Amer: >60 mL/min (ref >60)
Glucose, Bld: 137 mg/dL — ABNORMAL HIGH (ref 65–99)
Potassium: 4.1 mmol/L (ref 3.5–5.1)
Sodium: 131 mmol/L — ABNORMAL LOW (ref 135–145)
Total Bilirubin: 0.3 mg/dL (ref 0.3–1.2)
Total Protein: 5.8 g/dL — ABNORMAL LOW (ref 6.5–8.1)

## 2016-06-24 LAB — CBC
HCT: 33.1 % — ABNORMAL LOW (ref 36.0–46.0)
Hemoglobin: 11.3 g/dL — ABNORMAL LOW (ref 12.0–15.0)
MCH: 31.5 pg (ref 26.0–34.0)
MCHC: 34.1 g/dL (ref 30.0–36.0)
MCV: 92.2 fL (ref 78.0–100.0)
Platelets: 186 10*3/uL (ref 150–400)
RBC: 3.59 MIL/uL — ABNORMAL LOW (ref 3.87–5.11)
RDW: 13.4 % (ref 11.5–15.5)
WBC: 10.1 10*3/uL (ref 4.0–10.5)

## 2016-06-24 LAB — RPR: RPR Ser Ql: NONREACTIVE

## 2016-06-24 LAB — URIC ACID: Uric Acid, Serum: 6 mg/dL (ref 2.3–6.6)

## 2016-06-24 LAB — ABO/RH: ABO/RH(D): O POS

## 2016-06-24 MED ORDER — ZOLPIDEM TARTRATE 5 MG PO TABS
5.0000 mg | ORAL_TABLET | Freq: Every evening | ORAL | Status: DC | PRN
  Administered 2016-06-24: 5 mg via ORAL
  Filled 2016-06-24: qty 1

## 2016-06-24 MED ORDER — ONDANSETRON 4 MG PO TBDP
4.0000 mg | ORAL_TABLET | Freq: Three times a day (TID) | ORAL | Status: DC | PRN
Start: 2016-06-24 — End: 2016-06-26
  Administered 2016-06-24: 4 mg via ORAL
  Filled 2016-06-24: qty 1

## 2016-06-24 NOTE — Progress Notes (Signed)
Hospital day # 1 pregnancy at [redacted]w[redacted]d  PEC on MgSO4.  BMethasone x 1 dose received.  S: well, reports good fetal activity      Contractions:none      Vaginal bleeding:none now       Vaginal discharge: no significant change  O: BP 127/81 (BP Location: Left Arm)   Pulse 91   Temp 98.2 F (36.8 C) (Oral)   Resp 16   Ht 5\' 1"  (1.549 m)   Wt 130 lb (59 kg)   LMP 11/19/2015   BMI 24.56 kg/m        RCR, Lungs clear bilaterally      Fetal tracings:reviewed and reassuring      Uterus gravid and non-tender      Extremities: no significant edema and no signs of DVT.  No Clonus.  DTRs 2/4 bilat.   Today's PIH labs pending. 24 hr urine Prtns 06/23/16 at 628. [place] yesterday at [facility] EFW 20%, AC 0%, AFI 6+ cm, Dopplers wnl.  Cephalic.  A/P: [redacted]w[redacted]d with PEC on MgSO4.  Bmethasone 2nd dose today.  FHR Cat 1.  Cephalic.  EFW 20%, but AC 0%.  Will repeat US here next Monday.  Continuous monitoring.  Repeat PIH labs twice a week.       Barbara Pham,MARIE-LYNE  MD 06/24/2016 6:37 AM

## 2016-06-24 NOTE — Progress Notes (Signed)
Patient ID: Barbara Pham, female   DOB: 1991/06/10, 25 y.o.   MRN: [id number] Denies complaints. No HA or visual changes. BP 126/84 (BP Location: Left Arm)   Pulse 80   Temp 98.1 F (36.7 C) (Oral)   Resp 16   Ht 5\' 1"  (1.549 m)   Wt 59.9 kg (132 lb)   LMP 11/19/2015   BMI 24.94 kg/m   FHR reassuring DC MgSO4

## 2016-06-25 LAB — CULTURE, BETA STREP (GROUP B ONLY)

## 2016-06-25 NOTE — Progress Notes (Signed)
Patient ID: Barbara Pham, female   DOB: 1991-06-18, 25 y.o.   MRN: [id number] HD#2 [redacted]w[redacted]d PEC without severe features  S:No complaints. No HA. No SOB. No epigastric pain. Good FM . No LOF. No contractions.  O: Patient Vitals for the past 24 hrs:  BP Temp Temp src Pulse Resp Weight  06/25/16 0732 - - - - - 61.7 kg (136 lb)  06/25/16 0718 (!) 124/92 98.1 F (36.7 C) - 79 18 -  06/25/16 0650 125/87 - - 83 16 -  06/24/16 2200 - - - - 16 -  06/24/16 2100 - - - - 16 -  06/24/16 1955 126/84 98.1 F (36.7 C) Oral 80 16 -  06/24/16 1852 - - - - 16 -  06/24/16 1800 - - - - 16 -  06/24/16 1700 - - - - 16 -  06/24/16 1609 126/86 98.5 F (36.9 C) Oral 89 16 -  06/24/16 1500 - - - - 16 -  06/24/16 1400 - - - - 16 -  06/24/16 1300 - - - - 16 -  06/24/16 1154 125/85 98.4 F (36.9 C) Oral 91 16 -  06/24/16 1100 - - - - 16 -   HEENT: nl Neck : supple with FROM Lungs: CTA CV: RRR ABD: gravid, NT No CVAT EXT: neg c/c/e, DTRS 2+ Neuro: non focal Skin: intact  CBC    Component Value Date/Time   WBC 10.1 06/24/2016 0516   RBC 3.59 (L) 06/24/2016 0516   HGB 11.3 (L) 06/24/2016 0516   HCT 33.1 (L) 06/24/2016 0516   PLT 186 06/24/2016 0516   MCV 92.2 06/24/2016 0516   MCH 31.5 06/24/2016 0516   MCHC 34.1 06/24/2016 0516   RDW 13.4 06/24/2016 0516   LYMPHSABS 3.6 10/08/2014 1445   MONOABS 0.5 10/08/2014 1445   EOSABS 0.1 10/08/2014 1445   BASOSABS 0.1 10/08/2014 1445   CMP     Component Value Date/Time   NA 131 (L) 06/24/2016 0516   K 4.1 06/24/2016 0516   CL 102 06/24/2016 0516   CO2 21 (L) 06/24/2016 0516   GLUCOSE 137 (H) 06/24/2016 0516   BUN 13 06/24/2016 0516   CREATININE 0.64 06/24/2016 0516   CALCIUM 6.8 (L) 06/24/2016 0516   PROT 5.8 (L) 06/24/2016 0516   ALBUMIN 2.9 (L) 06/24/2016 0516   AST 16 06/24/2016 0516   ALT 12 (L) 06/24/2016 0516   ALKPHOS 76 06/24/2016 0516   BILITOT 0.3 06/24/2016 0516   GFRNONAA >60 06/24/2016 0516   GFRAA >60 06/24/2016 0516   EFM- FHR 120-130 with 10x10 accels, no decels  IMP: 31w 2d IUP PEC without severe features-BMZ complete AGA fetus with lagging AC and borderline oligo, NL UAD-reassuring surveillance  P: Labs in AM Serial BP BPP with UAD tomorrow May be candidate for out pt management

## 2016-06-26 ENCOUNTER — Inpatient Hospital Stay (HOSPITAL_COMMUNITY): Payer: BLUE CROSS/BLUE SHIELD

## 2016-06-26 LAB — COMPREHENSIVE METABOLIC PANEL
ALK PHOS: 68 U/L (ref 38–126)
ALT: 12 U/L — AB (ref 14–54)
AST: 16 U/L (ref 15–41)
Albumin: 2.6 g/dL — ABNORMAL LOW (ref 3.5–5.0)
Anion gap: 3 — ABNORMAL LOW (ref 5–15)
BILIRUBIN TOTAL: 0.2 mg/dL — AB (ref 0.3–1.2)
BUN: 17 mg/dL (ref 6–20)
CALCIUM: 7.6 mg/dL — AB (ref 8.9–10.3)
CO2: 24 mmol/L (ref 22–32)
CREATININE: 0.74 mg/dL (ref 0.44–1.00)
Chloride: 108 mmol/L (ref 101–111)
Glucose, Bld: 97 mg/dL (ref 65–99)
Potassium: 4.5 mmol/L (ref 3.5–5.1)
Sodium: 135 mmol/L (ref 135–145)
TOTAL PROTEIN: 4.9 g/dL — AB (ref 6.5–8.1)

## 2016-06-26 LAB — CBC
HEMATOCRIT: 29.3 % — AB (ref 36.0–46.0)
Hemoglobin: 9.6 g/dL — ABNORMAL LOW (ref 12.0–15.0)
MCH: 31.1 pg (ref 26.0–34.0)
MCHC: 32.8 g/dL (ref 30.0–36.0)
MCV: 94.8 fL (ref 78.0–100.0)
PLATELETS: 163 10*3/uL (ref 150–400)
RBC: 3.09 MIL/uL — AB (ref 3.87–5.11)
RDW: 14 % (ref 11.5–15.5)
WBC: 12.7 10*3/uL — AB (ref 4.0–10.5)

## 2016-06-26 LAB — LACTATE DEHYDROGENASE: LDH: 147 U/L (ref 98–192)

## 2016-06-26 NOTE — Discharge Summary (Addendum)
Patient ID: Barbara Pham, female   DOB: 1991/02/12, 25 y.o.   MRN: [id number] HD#3 [redacted]w[redacted]d PEC without severe features  S:No complaints. No HA. No SOB. No epigastric pain. Good FM . No LOF. No contractions.  O: Patient Vitals for the past 24 hrs:  BP Temp Temp src Pulse Resp Weight  06/26/16 1154 122/86 98.7 F (37.1 C) Oral 66 18 -  06/26/16 0959 112/79 98.5 F (36.9 C) Axillary 60 16 -  06/26/16 0926 - - - - - 61.1 kg (134 lb 12.8 oz)  06/26/16 0610 121/86 98.8 F (37.1 C) Oral (!) 58 16 -  06/25/16 2232 - - - - 16 -  06/25/16 2102 - - - - 16 -  06/25/16 2035 - - - - 16 -  06/25/16 1945 115/71 98.9 F (37.2 C) Oral 76 16 -  06/25/16 1624 127/88 98 F (36.7 C) Oral 83 18 -   HEENT: nl Neck : supple with FROM Lungs: CTA CV: RRR ABD: gravid, NT No CVAT EXT: neg c/c/e, DTRS 2+ Neuro: non focal Skin: intact  CBC    Component Value Date/Time   WBC 12.7 (H) 06/26/2016 0550   RBC 3.09 (L) 06/26/2016 0550   HGB 9.6 (L) 06/26/2016 0550   HCT 29.3 (L) 06/26/2016 0550   PLT 163 06/26/2016 0550   MCV 94.8 06/26/2016 0550   MCH 31.1 06/26/2016 0550   MCHC 32.8 06/26/2016 0550   RDW 14.0 06/26/2016 0550   LYMPHSABS 3.6 10/08/2014 1445   MONOABS 0.5 10/08/2014 1445   EOSABS 0.1 10/08/2014 1445   BASOSABS 0.1 10/08/2014 1445   CMP     Component Value Date/Time   NA 135 06/26/2016 0550   K 4.5 06/26/2016 0550   CL 108 06/26/2016 0550   CO2 24 06/26/2016 0550   GLUCOSE 97 06/26/2016 0550   BUN 17 06/26/2016 0550   CREATININE 0.74 06/26/2016 0550   CALCIUM 7.6 (L) 06/26/2016 0550   PROT 4.9 (L) 06/26/2016 0550   ALBUMIN 2.6 (L) 06/26/2016 0550   AST 16 06/26/2016 0550   ALT 12 (L) 06/26/2016 0550   ALKPHOS 68 06/26/2016 0550   BILITOT 0.2 (L) 06/26/2016 0550   GFRNONAA >60 06/26/2016 0550   GFRAA >60 06/26/2016 0550   EFM- FHR 120-130 with 10x10 accels, no decels BPP 6/8 (8/10) with NST, nl AFI. Nl UAD.  IMP: 31w 3d IUP PEC without severe features-BMZ  complete- BP stable with no meds, labs stable, Fetal status reassuring , no oligo noted AGA fetus with lagging AC   P: DC home Fu office tomorrow PEC precautions

## 2016-06-26 NOTE — Progress Notes (Signed)
Discussed fetal heart rate tracing with Dr Billy Coast.  Only having two 15x15 accels and also having a minute and a half variable.

## 2016-06-26 NOTE — Discharge Instructions (Signed)
Hypertension During Pregnancy °Hypertension is also called high blood pressure. Blood pressure moves blood in your body. Sometimes, the force that moves the blood becomes too strong. When you are pregnant, this condition should be watched carefully. It can cause problems for you and your baby. °HOME CARE  °· Make and keep all of your doctor visits. °· Take medicine as told by your doctor. Tell your doctor about all medicines you take. °· Eat very little salt. °· Exercise regularly. °· Do not drink alcohol. °· Do not smoke. °· Do not have drinks with caffeine. °· Lie on your left side when resting. °· Your health care provider may ask you to take one low-dose aspirin (81mg) each day. °GET HELP RIGHT AWAY IF: °· You have bad belly (abdominal) pain. °· You have sudden puffiness (swelling) in the hands, ankles, or face. °· You gain 4 pounds (1.8 kilograms) or more in 1 week. °· You throw up (vomit) repeatedly. °· You have bleeding from the vagina. °· You do not feel the baby moving as much. °· You have a headache. °· You have blurred or double vision. °· You have muscle twitching or spasms. °· You have shortness of breath. °· You have blue fingernails and lips. °· You have blood in your pee (urine). °MAKE SURE YOU: °· Understand these instructions. °· Will watch your condition. °· Will get help right away if you are not doing well or get worse. °  °This information is not intended to replace advice given to you by your health care provider. Make sure you discuss any questions you have with your health care provider. °  °Document Released: 12/17/2010 Document Revised: 12/05/2014 Document Reviewed: 06/13/2013 °Elsevier Interactive Patient Education ©2016 [employer]vier Inc. ° °

## 2016-06-28 ENCOUNTER — Inpatient Hospital Stay (HOSPITAL_COMMUNITY): Payer: BLUE CROSS/BLUE SHIELD | Admitting: Anesthesiology

## 2016-06-28 ENCOUNTER — Encounter (HOSPITAL_COMMUNITY)
Admission: AD | Disposition: A | Discharge status: Home / Self Care | Payer: Self-pay | Source: Ambulatory Visit | Attending: Obstetrics

## 2016-06-28 ENCOUNTER — Inpatient Hospital Stay (HOSPITAL_COMMUNITY)
Admission: AD | Admit: 2016-06-28 | Discharge: 2016-07-01 | DRG: 765 | Disposition: A | Discharge status: Home / Self Care | Payer: BLUE CROSS/BLUE SHIELD | Source: Ambulatory Visit | Attending: Obstetrics | Admitting: Obstetrics

## 2016-06-28 ENCOUNTER — Encounter (HOSPITAL_COMMUNITY): Payer: Self-pay | Admitting: *Deleted

## 2016-06-28 DIAGNOSIS — O1493 Unspecified pre-eclampsia, third trimester: Secondary | ICD-10-CM

## 2016-06-28 DIAGNOSIS — O365930 Maternal care for other known or suspected poor fetal growth, third trimester, not applicable or unspecified: Secondary | ICD-10-CM | POA: Diagnosis present

## 2016-06-28 DIAGNOSIS — O4103X0 Oligohydramnios, third trimester, not applicable or unspecified: Secondary | ICD-10-CM | POA: Diagnosis present

## 2016-06-28 DIAGNOSIS — O1414 Severe pre-eclampsia complicating childbirth: Principal | ICD-10-CM | POA: Diagnosis present

## 2016-06-28 DIAGNOSIS — Z3A31 31 weeks gestation of pregnancy: Secondary | ICD-10-CM | POA: Diagnosis not present

## 2016-06-28 DIAGNOSIS — Z91048 Other nonmedicinal substance allergy status: Secondary | ICD-10-CM

## 2016-06-28 DIAGNOSIS — K219 Gastro-esophageal reflux disease without esophagitis: Secondary | ICD-10-CM | POA: Diagnosis present

## 2016-06-28 DIAGNOSIS — O1410 Severe pre-eclampsia, unspecified trimester: Secondary | ICD-10-CM | POA: Diagnosis present

## 2016-06-28 DIAGNOSIS — Z23 Encounter for immunization: Secondary | ICD-10-CM | POA: Diagnosis not present

## 2016-06-28 LAB — FIBRINOGEN: FIBRINOGEN: 280 mg/dL (ref 210–475)

## 2016-06-28 LAB — SAVE SMEAR

## 2016-06-28 LAB — COMPREHENSIVE METABOLIC PANEL
ALBUMIN: 3 g/dL — AB (ref 3.5–5.0)
ALBUMIN: 2.8 g/dL — AB (ref 3.5–5.0)
ALK PHOS: 75 U/L (ref 38–126)
ALT: 53 U/L (ref 14–54)
ALT: 91 U/L — ABNORMAL HIGH (ref 14–54)
ANION GAP: 10 (ref 5–15)
AST: 62 U/L — AB (ref 15–41)
AST: 96 U/L — AB (ref 15–41)
Alkaline Phosphatase: 83 U/L (ref 38–126)
Anion gap: 8 (ref 5–15)
BILIRUBIN TOTAL: 0.5 mg/dL (ref 0.3–1.2)
BILIRUBIN TOTAL: 0.4 mg/dL (ref 0.3–1.2)
BUN: 16 mg/dL (ref 6–20)
BUN: 14 mg/dL (ref 6–20)
CALCIUM: 7.3 mg/dL — AB (ref 8.9–10.3)
CHLORIDE: 104 mmol/L (ref 101–111)
CO2: 20 mmol/L — AB (ref 22–32)
CO2: 22 mmol/L (ref 22–32)
Calcium: 8.5 mg/dL — ABNORMAL LOW (ref 8.9–10.3)
Chloride: 99 mmol/L — ABNORMAL LOW (ref 101–111)
Creatinine, Ser: 0.71 mg/dL (ref 0.44–1.00)
Creatinine, Ser: 0.77 mg/dL (ref 0.44–1.00)
GFR calc Af Amer: >60 mL/min (ref >60)
GFR calc Af Amer: >60 mL/min (ref >60)
GFR calc non Af Amer: >60 mL/min (ref >60)
GFR calc non Af Amer: >60 mL/min (ref >60)
GLUCOSE: 72 mg/dL (ref 65–99)
GLUCOSE: 115 mg/dL — AB (ref 65–99)
POTASSIUM: 4.3 mmol/L (ref 3.5–5.1)
POTASSIUM: 4.5 mmol/L (ref 3.5–5.1)
SODIUM: 134 mmol/L — AB (ref 135–145)
Sodium: 129 mmol/L — ABNORMAL LOW (ref 135–145)
TOTAL PROTEIN: 5.4 g/dL — AB (ref 6.5–8.1)
Total Protein: 5.8 g/dL — ABNORMAL LOW (ref 6.5–8.1)

## 2016-06-28 LAB — CBC
HEMATOCRIT: 34.4 % — AB (ref 36.0–46.0)
Hemoglobin: 11.8 g/dL — ABNORMAL LOW (ref 12.0–15.0)
MCH: 31.5 pg (ref 26.0–34.0)
MCHC: 34.3 g/dL (ref 30.0–36.0)
MCV: 91.7 fL (ref 78.0–100.0)
PLATELETS: 184 10*3/uL (ref 150–400)
RBC: 3.75 MIL/uL — ABNORMAL LOW (ref 3.87–5.11)
RDW: 13.3 % (ref 11.5–15.5)
WBC: 17.3 10*3/uL — AB (ref 4.0–10.5)

## 2016-06-28 LAB — LACTATE DEHYDROGENASE: LDH: 263 U/L — ABNORMAL HIGH (ref 98–192)

## 2016-06-28 LAB — TYPE AND SCREEN
ABO/RH(D): O POS
ANTIBODY SCREEN: NEGATIVE

## 2016-06-28 LAB — PLATELET COUNT: PLATELETS: 177 10*3/uL (ref 150–400)

## 2016-06-28 LAB — APTT: aPTT: 24 seconds (ref 24–36)

## 2016-06-28 LAB — PROTIME-INR
INR: 0.97
Prothrombin Time: 12.9 seconds (ref 11.4–15.2)

## 2016-06-28 SURGERY — Surgical Case
Anesthesia: Spinal | Site: Abdomen

## 2016-06-28 MED ORDER — MIDAZOLAM HCL 2 MG/2ML IJ SOLN: 0.5000 mg | Freq: Once | INTRAMUSCULAR | Status: DC | PRN

## 2016-06-28 MED ORDER — MENTHOL 3 MG MT LOZG
1.0000 | LOZENGE | OROMUCOSAL | Status: DC | PRN
  Filled 2016-06-28: qty 9

## 2016-06-28 MED ORDER — LACTATED RINGERS IV SOLN
INTRAVENOUS | Status: DC | PRN
  Administered 2016-06-28: 16:00:00 via INTRAVENOUS

## 2016-06-28 MED ORDER — SCOPOLAMINE 1 MG/3DAYS TD PT72: 1.0000 | MEDICATED_PATCH | Freq: Once | TRANSDERMAL | Status: DC

## 2016-06-28 MED ORDER — SODIUM CHLORIDE 0.9 % IR SOLN
Status: DC | PRN
  Administered 2016-06-28: 1000 mL

## 2016-06-28 MED ORDER — ONDANSETRON HCL 4 MG/2ML IJ SOLN
4.0000 mg | Freq: Once | INTRAMUSCULAR | Status: AC
  Administered 2016-06-28: 4 mg via INTRAVENOUS
  Filled 2016-06-28: qty 2

## 2016-06-28 MED ORDER — LACTATED RINGERS IV SOLN
INTRAVENOUS | Status: DC | PRN
  Administered 2016-06-28 (×2): via INTRAVENOUS

## 2016-06-28 MED ORDER — LACTATED RINGERS IV SOLN
INTRAVENOUS | Status: DC
Start: 2016-06-28 — End: 2016-06-28
  Administered 2016-06-28: 11:00:00 via INTRAVENOUS

## 2016-06-28 MED ORDER — COCONUT OIL OIL
1.0000 "application " | TOPICAL_OIL | Status: DC | PRN
  Administered 2016-06-30: 1 via TOPICAL
  Filled 2016-06-28: qty 120

## 2016-06-28 MED ORDER — FENTANYL CITRATE (PF) 100 MCG/2ML IJ SOLN
INTRAMUSCULAR | Status: DC | PRN
  Administered 2016-06-28: 10 ug via INTRATHECAL
  Administered 2016-06-28 (×5): 50 ug via INTRAVENOUS
  Administered 2016-06-28: 90 ug via INTRAVENOUS

## 2016-06-28 MED ORDER — SCOPOLAMINE 1 MG/3DAYS TD PT72
MEDICATED_PATCH | TRANSDERMAL | Status: AC
  Filled 2016-06-28: qty 1

## 2016-06-28 MED ORDER — NALOXONE HCL 0.4 MG/ML IJ SOLN: 0.4000 mg | INTRAMUSCULAR | Status: DC | PRN

## 2016-06-28 MED ORDER — KETOROLAC TROMETHAMINE 30 MG/ML IJ SOLN
30.0000 mg | Freq: Four times a day (QID) | INTRAMUSCULAR | Status: AC | PRN
  Filled 2016-06-28: qty 1

## 2016-06-28 MED ORDER — DIPHENHYDRAMINE HCL 50 MG/ML IJ SOLN: 12.5000 mg | INTRAMUSCULAR | Status: DC | PRN

## 2016-06-28 MED ORDER — CEFAZOLIN SODIUM-DEXTROSE 2-3 GM-% IV SOLR
INTRAVENOUS | Status: DC | PRN
  Administered 2016-06-28: 2 g via INTRAVENOUS

## 2016-06-28 MED ORDER — DIPHENHYDRAMINE HCL 25 MG PO CAPS: 25.0000 mg | ORAL_CAPSULE | ORAL | Status: DC | PRN

## 2016-06-28 MED ORDER — ONDANSETRON HCL 4 MG/2ML IJ SOLN
INTRAMUSCULAR | Status: AC
  Filled 2016-06-28: qty 2

## 2016-06-28 MED ORDER — FENTANYL CITRATE (PF) 100 MCG/2ML IJ SOLN
25.0000 ug | INTRAMUSCULAR | Status: DC | PRN
  Administered 2016-06-28 (×2): 25 ug via INTRAVENOUS

## 2016-06-28 MED ORDER — MAGNESIUM SULFATE 50 % IJ SOLN
2.0000 g/h | INTRAVENOUS | Status: DC
  Filled 2016-06-28: qty 80

## 2016-06-28 MED ORDER — FENTANYL CITRATE (PF) 100 MCG/2ML IJ SOLN
INTRAMUSCULAR | Status: AC
  Filled 2016-06-28: qty 2

## 2016-06-28 MED ORDER — WITCH HAZEL-GLYCERIN EX PADS: 1.0000 "application " | MEDICATED_PAD | CUTANEOUS | Status: DC | PRN

## 2016-06-28 MED ORDER — MORPHINE SULFATE (PF) 0.5 MG/ML IJ SOLN
INTRAMUSCULAR | Status: DC | PRN
  Administered 2016-06-28: .3 mg via INTRAVENOUS
  Administered 2016-06-28: .2 mg via INTRATHECAL

## 2016-06-28 MED ORDER — ONDANSETRON HCL 4 MG/2ML IJ SOLN
INTRAMUSCULAR | Status: DC | PRN
  Administered 2016-06-28: 4 mg via INTRAVENOUS

## 2016-06-28 MED ORDER — MEPERIDINE HCL 25 MG/ML IJ SOLN
6.2500 mg | INTRAMUSCULAR | Status: DC | PRN
Start: 2016-06-28 — End: 2016-06-28

## 2016-06-28 MED ORDER — KETOROLAC TROMETHAMINE 30 MG/ML IJ SOLN
30.0000 mg | Freq: Four times a day (QID) | INTRAMUSCULAR | Status: AC | PRN
  Administered 2016-06-29: 30 mg via INTRAVENOUS
  Filled 2016-06-28 (×3): qty 1

## 2016-06-28 MED ORDER — DIBUCAINE 1 % RE OINT: 1.0000 "application " | TOPICAL_OINTMENT | RECTAL | Status: DC | PRN

## 2016-06-28 MED ORDER — NALBUPHINE HCL 10 MG/ML IJ SOLN: 5.0000 mg | Freq: Once | INTRAMUSCULAR | Status: DC | PRN

## 2016-06-28 MED ORDER — FENTANYL CITRATE (PF) 250 MCG/5ML IJ SOLN
INTRAMUSCULAR | Status: AC
  Filled 2016-06-28: qty 5

## 2016-06-28 MED ORDER — OXYTOCIN 10 UNIT/ML IJ SOLN
INTRAVENOUS | Status: DC | PRN
  Administered 2016-06-28: 40 [IU] via INTRAVENOUS

## 2016-06-28 MED ORDER — SCOPOLAMINE 1 MG/3DAYS TD PT72
MEDICATED_PATCH | TRANSDERMAL | Status: DC | PRN
  Administered 2016-06-28: 1 via TRANSDERMAL

## 2016-06-28 MED ORDER — TETANUS-DIPHTH-ACELL PERTUSSIS 5-2.5-18.5 LF-MCG/0.5 IM SUSP
0.5000 mL | Freq: Once | INTRAMUSCULAR | Status: AC
  Administered 2016-07-01: 0.5 mL via INTRAMUSCULAR
  Filled 2016-06-28: qty 0.5

## 2016-06-28 MED ORDER — PHENYLEPHRINE 8 MG IN D5W 100 ML (0.08MG/ML) PREMIX OPTIME
INJECTION | INTRAVENOUS | Status: DC | PRN
  Administered 2016-06-28: 50 ug/min via INTRAVENOUS

## 2016-06-28 MED ORDER — MAGNESIUM SULFATE BOLUS VIA INFUSION
4.0000 g | Freq: Once | INTRAVENOUS | Status: AC
  Administered 2016-06-28: 4 g via INTRAVENOUS
  Filled 2016-06-28: qty 500

## 2016-06-28 MED ORDER — PANTOPRAZOLE SODIUM 40 MG PO TBEC
40.0000 mg | DELAYED_RELEASE_TABLET | Freq: Every day | ORAL | Status: DC
  Administered 2016-06-29 – 2016-07-01 (×3): 40 mg via ORAL
  Filled 2016-06-28 (×3): qty 1

## 2016-06-28 MED ORDER — PHENYLEPHRINE 8 MG IN D5W 100 ML (0.08MG/ML) PREMIX OPTIME
INJECTION | INTRAVENOUS | Status: AC
  Filled 2016-06-28: qty 100

## 2016-06-28 MED ORDER — PROMETHAZINE HCL 25 MG/ML IJ SOLN: 6.2500 mg | INTRAMUSCULAR | Status: DC | PRN

## 2016-06-28 MED ORDER — CEFAZOLIN SODIUM-DEXTROSE 2-4 GM/100ML-% IV SOLN
INTRAVENOUS | Status: AC
  Filled 2016-06-28: qty 100

## 2016-06-28 MED ORDER — NALOXONE HCL 2 MG/2ML IJ SOSY: 1.0000 ug/kg/h | PREFILLED_SYRINGE | INTRAVENOUS | Status: DC | PRN

## 2016-06-28 MED ORDER — IBUPROFEN 600 MG PO TABS
600.0000 mg | ORAL_TABLET | Freq: Four times a day (QID) | ORAL | Status: DC
  Administered 2016-06-29 – 2016-07-01 (×10): 600 mg via ORAL
  Filled 2016-06-28 (×10): qty 1

## 2016-06-28 MED ORDER — MEPERIDINE HCL 25 MG/ML IJ SOLN: 6.2500 mg | INTRAMUSCULAR | Status: DC | PRN

## 2016-06-28 MED ORDER — NALBUPHINE HCL 10 MG/ML IJ SOLN: 5.0000 mg | INTRAMUSCULAR | Status: DC | PRN

## 2016-06-28 MED ORDER — MORPHINE SULFATE-NACL 0.5-0.9 MG/ML-% IV SOSY
PREFILLED_SYRINGE | INTRAVENOUS | Status: AC
  Filled 2016-06-28: qty 1

## 2016-06-28 MED ORDER — DEXAMETHASONE SODIUM PHOSPHATE 4 MG/ML IJ SOLN
INTRAMUSCULAR | Status: DC | PRN
  Administered 2016-06-28: 4 mg via INTRAVENOUS

## 2016-06-28 MED ORDER — SIMETHICONE 80 MG PO CHEW
80.0000 mg | CHEWABLE_TABLET | ORAL | Status: DC
  Administered 2016-06-29 – 2016-06-30 (×3): 80 mg via ORAL
  Filled 2016-06-28 (×3): qty 1

## 2016-06-28 MED ORDER — SOD CITRATE-CITRIC ACID 500-334 MG/5ML PO SOLN
ORAL | Status: AC
  Administered 2016-06-28: 30 mL
  Filled 2016-06-28: qty 15

## 2016-06-28 MED ORDER — MAGNESIUM SULFATE 50 % IJ SOLN
2.0000 g/h | INTRAVENOUS | Status: DC
  Administered 2016-06-29: 2 g/h via INTRAVENOUS
  Filled 2016-06-28 (×2): qty 80

## 2016-06-28 MED ORDER — PHENYLEPHRINE 40 MCG/ML (10ML) SYRINGE FOR IV PUSH (FOR BLOOD PRESSURE SUPPORT): PREFILLED_SYRINGE | INTRAVENOUS | Status: DC | PRN

## 2016-06-28 MED ORDER — SENNOSIDES-DOCUSATE SODIUM 8.6-50 MG PO TABS
2.0000 | ORAL_TABLET | ORAL | Status: DC
  Administered 2016-06-29 – 2016-06-30 (×3): 2 via ORAL
  Filled 2016-06-28 (×3): qty 2

## 2016-06-28 MED ORDER — SIMETHICONE 80 MG PO CHEW: 80.0000 mg | CHEWABLE_TABLET | ORAL | Status: DC | PRN

## 2016-06-28 MED ORDER — OXYTOCIN 40 UNITS IN LACTATED RINGERS INFUSION - SIMPLE MED: 2.5000 [IU]/h | INTRAVENOUS | Status: AC

## 2016-06-28 MED ORDER — SODIUM CHLORIDE 0.9% FLUSH: 3.0000 mL | INTRAVENOUS | Status: DC | PRN

## 2016-06-28 MED ORDER — BUPIVACAINE IN DEXTROSE 0.75-8.25 % IT SOLN
INTRATHECAL | Status: DC | PRN
  Administered 2016-06-28: 10.5 mg via INTRATHECAL

## 2016-06-28 MED ORDER — PRENATAL MULTIVITAMIN CH
1.0000 | ORAL_TABLET | Freq: Every day | ORAL | Status: DC
  Administered 2016-06-29 – 2016-07-01 (×3): 1 via ORAL
  Filled 2016-06-28 (×3): qty 1

## 2016-06-28 MED ORDER — SIMETHICONE 80 MG PO CHEW
80.0000 mg | CHEWABLE_TABLET | Freq: Three times a day (TID) | ORAL | Status: DC
  Administered 2016-06-29 – 2016-07-01 (×6): 80 mg via ORAL
  Filled 2016-06-28 (×8): qty 1

## 2016-06-28 MED ORDER — DIPHENHYDRAMINE HCL 25 MG PO CAPS: 25.0000 mg | ORAL_CAPSULE | Freq: Four times a day (QID) | ORAL | Status: DC | PRN

## 2016-06-28 MED ORDER — LACTATED RINGERS IV SOLN
INTRAVENOUS | Status: DC
  Administered 2016-06-29 (×2): via INTRAVENOUS

## 2016-06-28 MED ORDER — DEXAMETHASONE SODIUM PHOSPHATE 4 MG/ML IJ SOLN
INTRAMUSCULAR | Status: AC
  Filled 2016-06-28: qty 1

## 2016-06-28 MED ORDER — OXYTOCIN 10 UNIT/ML IJ SOLN
INTRAMUSCULAR | Status: AC
  Filled 2016-06-28: qty 4

## 2016-06-28 MED ORDER — ONDANSETRON HCL 4 MG/2ML IJ SOLN: 4.0000 mg | Freq: Three times a day (TID) | INTRAMUSCULAR | Status: DC | PRN

## 2016-06-28 SURGICAL SUPPLY — 30 items
BENZOIN TINCTURE PRP APPL 2/3 (GAUZE/BANDAGES/DRESSINGS) ×3 IMPLANT
CHLORAPREP W/TINT 26ML (MISCELLANEOUS) ×3 IMPLANT
CLAMP CORD UMBIL (MISCELLANEOUS) ×3 IMPLANT
CLOSURE WOUND 1/2 X4 (GAUZE/BANDAGES/DRESSINGS) ×1
CLOTH BEACON ORANGE TIMEOUT ST (SAFETY) ×3 IMPLANT
DRSG OPSITE POSTOP 4X10 (GAUZE/BANDAGES/DRESSINGS) ×3 IMPLANT
ELECT REM PT RETURN 9FT ADLT (ELECTROSURGICAL) ×3
ELECTRODE REM PT RTRN 9FT ADLT (ELECTROSURGICAL) ×1 IMPLANT
GLOVE BIO SURGEON STRL SZ 6.5 (GLOVE) ×2 IMPLANT
GLOVE BIO SURGEONS STRL SZ 6.5 (GLOVE) ×1
GLOVE BIOGEL PI IND STRL 7.0 (GLOVE) ×2 IMPLANT
GLOVE BIOGEL PI INDICATOR 7.0 (GLOVE) ×4
GOWN STRL REUS W/TWL LRG LVL3 (GOWN DISPOSABLE) ×6 IMPLANT
KIT ABG SYR 3ML LUER SLIP (SYRINGE) ×3 IMPLANT
NEEDLE HYPO 25X5/8 SAFETYGLIDE (NEEDLE) ×3 IMPLANT
NS IRRIG 1000ML POUR BTL (IV SOLUTION) ×3 IMPLANT
PACK C SECTION WH (CUSTOM PROCEDURE TRAY) ×3 IMPLANT
PAD OB MATERNITY 4.3X12.25 (PERSONAL CARE ITEMS) ×3 IMPLANT
PENCIL SMOKE EVAC W/HOLSTER (ELECTROSURGICAL) ×3 IMPLANT
STRIP CLOSURE SKIN 1/2X4 (GAUZE/BANDAGES/DRESSINGS) ×2 IMPLANT
SUT MON AB 4-0 PS1 27 (SUTURE) ×3 IMPLANT
SUT PLAIN 2 0 (SUTURE) ×2
SUT PLAIN ABS 2-0 CT1 27XMFL (SUTURE) ×1 IMPLANT
SUT VIC AB 0 CT1 36 (SUTURE) ×6 IMPLANT
SUT VIC AB 0 CTX 36 (SUTURE) ×6
SUT VIC AB 0 CTX36XBRD ANBCTRL (SUTURE) ×3 IMPLANT
SUT VIC AB 2-0 CT1 27 (SUTURE) ×2
SUT VIC AB 2-0 CT1 TAPERPNT 27 (SUTURE) ×1 IMPLANT
TOWEL OR 17X24 6PK STRL BLUE (TOWEL DISPOSABLE) ×3 IMPLANT
TRAY FOLEY CATH SILVER 14FR (SET/KITS/TRAYS/PACK) ×3 IMPLANT

## 2016-06-28 NOTE — Op Note (Signed)
06/28/2016  4:20 PM  PATIENT:  Barbara Pham  25 y.o. female  PRE-OPERATIVE DIAGNOSIS:  severe preeclampsia, remote from delivery, pt request for primary cesarean section  POST-OPERATIVE DIAGNOSIS:  severe preeclampsia,  remote from delivery, pt request for primary cesarean section  PROCEDURE:  Procedure(s): CESAREAN SECTION (N/A)  SURGEON:  Surgeon(s) and Role:    * Noland Fordyce, MD - Primary  PHYSICIAN ASSISTANT:   ASSISTANTS: Elmer Picker, CNM   ANESTHESIA:   spinal  EBL:  Total I/O In: 770.8 [I.V.:770.8] Out: 575 [Urine:75; Blood:500]  BLOOD ADMINISTERED:none  DRAINS: Urinary Catheter (Foley)   LOCAL MEDICATIONS USED:  NONE  SPECIMEN:  Source of Specimen:  Placenta  DISPOSITION OF SPECIMEN:  PATHOLOGY  COUNTS:  YES  TOURNIQUET:  * No tourniquets in log *  DICTATION: .Note written in EPIC  PLAN OF CARE: Admit to inpatient   PATIENT DISPOSITION:  PACU - hemodynamically stable.   Delay start of Pharmacological VTE agent (>24hrs) due to surgical blood loss or risk of bleeding: yes    Findings:  @ infant,  APGAR (1 MIN): 8   APGAR (5 MINS): 9   APGAR (10 MINS):   Normal  small  uterus,  normal tubes and ovaries, normal placenta. 3VC, clear amniotic fluid  EBL: 500 cc Antibiotics:   2g Ancef Complications: none  Indications: This is a 25 y.o. year-old, G1  At [redacted]w[redacted]d admitted for worsening right upper quadrant pain in the setting of preeclampsia. Elevated SD Dopplers and tripling of her LFTs and continued pain prompted decision to move to delivery. After risks benefits and discussion of cervical ripening and induction of labor versus primary cesarean section patient requested the latter. Risks benefits and alternatives of the procedure were discussed with the patient who agreed to proceed  Procedure:  After informed consent was obtained the patient was taken to the operating room where spinal anesthesia was initiated.  She was prepped and draped  in the normal sterile fashion in dorsal supine position with a leftward tilt.  A foley catheter was in place.  A Pfannenstiel skin incision was made 2 cm above the pubic symphysis in the midline with the scalpel.  Dissection was carried down with the Bovie cautery until the fascia was reached. The fascia was incised in the midline. The incision was extended laterally with the Mayo scissors. The inferior aspect of the fascial incision was grasped with the Coker clamps, elevated up and the underlying rectus muscles were dissected off sharply. The superior aspect of the fascial incision was grasped with the Coker clamps elevated up and the underlying rectus muscles were dissected off sharply.  The peritoneum was entered bluntly. The peritoneal incision was extended superiorly and inferiorly with good visualization of the bladder. The bladder blade was inserted and a bladder flap was created sharply. Palpation was done to assess the fetal position and the location of the uterine vessels. Given the early gestational age and the small baby consideration was given to making a classical uterine incision. However it was felt that there was sufficient room to make a transverse incision. The lower segment of the uterus was incised sharply with the scalpel and extended  bluntly in the cephalo-caudal fashion. The infant was grasped, brought to the incision,  rotated and the infant was delivered with fundal pressure. The nose and mouth were bulb suctioned. The cord was clamped and cut after 1 minute of life. Delayed cord clamping was possible given the baby with good movement and spontaneous  cry.  The infant was handed off to the waiting pediatrician. The placenta was expressed. Attempt was made to obtain a cord pH however no arterial blood was within the cord . The uterus was exteriorized. The uterus was cleared of all clots and debris. The uterine incision was repaired with 0 Vicryl in a running locked fashion.  A second layer  of the same suture was used in an imbricating fashion to obtain excellent hemostasis. A third figure-of-eight was placed in the midpoint of the uterus The uterus was then returned to the abdomen, the gutters were cleared of all clots and debris. The uterine incision was reinspected and found to be hemostatic. The peritoneum was grasped and closed with 2-0 Vicryl in a running fashion. The cut muscle edges and the underside of the fascia were inspected and found to be hemostatic. The fascia was closed with 0 Vicryl in a single layer . The subcutaneous tissue was irrigated. Scarpa's layer was closed with a 2-0 plain gut suture. The skin was closed with a 4-0 Monocryl in a single layer. The patient tolerated the procedure well. Sponge lap and needle counts were correct x3 and patient was taken to the recovery room in a stable condition.  Barbara Pham A. 06/28/2016 4:23 PM

## 2016-06-28 NOTE — Brief Op Note (Signed)
06/28/2016  4:20 PM  PATIENT:  Barbara Pham  25 y.o. female  PRE-OPERATIVE DIAGNOSIS:  severe preeclampsia, remote from delivery, pt request for primary cesarean section  POST-OPERATIVE DIAGNOSIS:  severe preeclampsia,  remote from delivery, pt request for primary cesarean section  PROCEDURE:  Procedure(s): CESAREAN SECTION (N/A)  SURGEON:  Surgeon(s) and Role:    * Noland Fordyce, MD - Primary  PHYSICIAN ASSISTANT:   ASSISTANTS: Elmer Picker, CNM   ANESTHESIA:   spinal  EBL:  Total I/O In: 770.8 [I.V.:770.8] Out: 575 [Urine:75; Blood:500]  BLOOD ADMINISTERED:none  DRAINS: Urinary Catheter (Foley)   LOCAL MEDICATIONS USED:  NONE  SPECIMEN:  Source of Specimen:  Placenta  DISPOSITION OF SPECIMEN:  PATHOLOGY  COUNTS:  YES  TOURNIQUET:  * No tourniquets in log *  DICTATION: .Note written in EPIC  PLAN OF CARE: Admit to inpatient   PATIENT DISPOSITION:  PACU - hemodynamically stable.   Delay start of Pharmacological VTE agent (>24hrs) due to surgical blood loss or risk of bleeding: yes

## 2016-06-28 NOTE — Anesthesia Postprocedure Evaluation (Signed)
Anesthesia Post Note  Patient: Barbara Pham  Procedure(s) Performed: Procedure(s) (LRB): CESAREAN SECTION (N/A)  Patient location during evaluation: PACU Anesthesia Type: Spinal Level of consciousness: awake and alert, oriented and patient cooperative Pain management: pain level controlled Vital Signs Assessment: post-procedure vital signs reviewed and stable Respiratory status: spontaneous breathing, nonlabored ventilation and respiratory function stable Cardiovascular status: blood pressure returned to baseline and stable Postop Assessment: spinal receding and patient able to bend at knees Anesthetic complications: no     Last Vitals:  Vitals:   06/28/16 1730 06/28/16 1809  BP: (!) 130/96   Pulse: 66   Resp: 12 16  Temp:  36.8 C    Last Pain:  Vitals:   06/28/16 1809  TempSrc: Oral  PainSc:    Pain Goal:                 Tenecia Ignasiak,E. Anthea Udovich

## 2016-06-28 NOTE — Anesthesia Preprocedure Evaluation (Signed)
Anesthesia Evaluation  Patient identified by MRN, date of birth, ID band Patient awake    Reviewed: Allergy & Precautions, NPO status , Patient's Chart, lab work & pertinent test results  History of Anesthesia Complications Negative for: history of anesthetic complications  Airway Mallampati: II  TM Distance: >3 FB Neck ROM: Full    Dental  (+) Dental Advisory Given   Pulmonary neg pulmonary ROS,    breath sounds clear to auscultation       Cardiovascular hypertension (pre-eclamsia),  Rhythm:Regular Rate:Normal     Neuro/Psych negative neurological ROS     GI/Hepatic Neg liver ROS, GERD  Medicated,  Endo/Other  negative endocrine ROS  Renal/GU negative Renal ROS     Musculoskeletal   Abdominal   Peds  Hematology negative hematology ROS (+) Hb 11.8, plt 177K   Anesthesia Other Findings   Reproductive/Obstetrics (+) Pregnancy                             Anesthesia Physical Anesthesia Plan  ASA: III  Anesthesia Plan: Spinal   Post-op Pain Management:    Induction:   Airway Management Planned: Natural Airway  Additional Equipment:   Intra-op Plan:   Post-operative Plan:   Informed Consent: I have reviewed the patients History and Physical, chart, labs and discussed the procedure including the risks, benefits and alternatives for the proposed anesthesia with the patient or authorized representative who has indicated his/her understanding and acceptance.   Dental advisory given  Plan Discussed with: CRNA and Surgeon  Anesthesia Plan Comments: (Plan routine monitors, SAB)        Anesthesia Quick Evaluation

## 2016-06-28 NOTE — Anesthesia Procedure Notes (Signed)
Spinal  Patient location during procedure: OR End time: 06/28/2016 3:22 PM Staffing Anesthesiologist: Jairo Ben Performed: anesthesiologist  Preanesthetic Checklist Completed: patient identified, surgical consent, pre-op evaluation, timeout performed, IV checked, risks and benefits discussed and monitors and equipment checked Spinal Block Patient position: sitting Prep: site prepped and draped and DuraPrep Patient monitoring: heart rate, cardiac monitor, continuous pulse ox and blood pressure Approach: midline Location: L3-4 Needle Needle type: Quincke  Needle gauge: 25 G Needle length: 9 cm Additional Notes Pt identified in Operating room.  Monitors applied. Working IV access confirmed. Sterile prep, drape lumbar spine.  1% lido local L 3,4.  #25ga Quincke into clear CSF L 3,4.  10.5 mg 0.75% Bupivacaine with dextrose, fentanyl, morphine injected with asp CSF beginning and end of injection.  Patient asymptomatic, VSS, no heme aspirated, tolerated well.  Sandford Craze, MD

## 2016-06-28 NOTE — H&P (Signed)
Barbara Pham is a 25 y.o. G1P0 at [redacted]w[redacted]d presenting for hypertension and worsening right upper quadrant pain.  Patient has had an uncomplicated pregnancy until proteinuria was noted on 724 at 30 weeks. She followed up several days later again with proteinuria and this time with elevated blood pressure 124/100, 124/92. At that point she had reassuring BPP of 6 out of 8 with reactive NST. Ultrasound was done which showed fetal growth 2 lbs. 11 oz. at the 20th percentile and AFI of 6.5 an abdominal circumference of 0%. Given the proteinuria and the elevated blood pressure along with the developing IUGR patient was admitted to the hospital for magnesium and betamethasone. She received betamethasone on July 27 and 28th and had continuous fetal monitoring with serial blood pressures and labs which were all without evidence of severe preeclampsia. She had a biophysical profile of 6 out of 8 on July 30 and SD Dopplers elevated at the 94th percentile. She was discharged to home with plan for outpatient monitoring of her preeclampsia not yet severe.  Patient returns to the office today without headache or scotomata but with severe right upper quadrant pain her blood pressures were noted to be 156/102 and she had a BPP of 6/8. She was brought to the hospital for further evaluation. Admitting labs showed platelets in the 160s and AST 62, ALT 53   Pt notes no contractions. Adequate fetal movement, No vaginal bleeding, not leaking fluid. Patient gets complaint is right upper quadrant pain.   [facility] since first trimester - Declined genetic screening - Preeclampsia as discussed above   Prenatal Transfer Tool  Maternal Diabetes: No Genetic Screening: Declined Maternal Ultrasounds/Referrals: Abnormal:  Findings:   Other: low amniotic fluid at 6.5, 2nd percentile for gestational age, AC at 0 percentile Fetal Ultrasounds or other Referrals:  Referred to Materal Fetal Medicine  Maternal Substance  Abuse:  No Significant Maternal Medications:  None Significant Maternal Lab Results: None     OB History    Gravida Para Term Preterm AB Living   1             SAB TAB Ectopic Multiple Live Births                 Past Medical History:  Diagnosis Date  . Acid reflux   . Cat allergies   . Multiple allergies    dust and pollen  . Pollen allergies    Past Surgical History:  Procedure Laterality Date  . feet surgery     had to stretch bones due to growth plates closing too soon  . TONSILLECTOMY AND ADENOIDECTOMY     Family History: family history is not on file. Social History:  reports that she has never smoked. She has never used smokeless tobacco. She reports that she drinks alcohol. She reports that she does not use drugs.  Review of Systems - Negative except Right upper quadrant pain     Temperature 98.7 F (37.1 C), temperature source Oral, resp. rate 17, height  (1.549 m), weight 59.9 kg (132 lb), last menstrual period 11/19/2015.  Physical Exam:  Gen: Appears uncomfortable but not in distress Abd: gravid, NT, mild RUQ pain LE: Trace edema, equal bilaterally, non-tender no clonus, DTRs 3+ despite magnesium.  Toco: None FH: baseline 120s ,  10 x 10  accelerations present, no deceleratons at this time but upon admission patient had several prolonged decelerations up to 3 minutes, 5-10 beat variability  Prenatal labs: ABO,  Rh: --/--/O POS (08/01 1125) Antibody: NEG (08/01 1125) Rubella: !Error! immune  RPR: Non Reactive (07/27 1910)  HBsAg:    negative  HIV:    negative  GBS:    negative 1 hr Glucola 92   Genetic screening declined AnatomyUS normal  CBC Latest Ref Rng & Units 06/28/2016 06/28/2016 06/26/2016  WBC 4.0 - 10.5 K/uL - 17.3(H) 12.7(H)  Hemoglobin 12.0 - 15.0 g/dL - 11.8(L) 9.6(L)  Hematocrit 36.0 - 46.0 % - 34.4(L) 29.3(L)  Platelets 150 - 400 K/uL 177 184 163   CMP     Component Value Date/Time   NA 134 (L) 06/28/2016 1125   K 4.3  06/28/2016 1125   CL 104 06/28/2016 1125   CO2 20 (L) 06/28/2016 1125   GLUCOSE 72 06/28/2016 1125   BUN 16 06/28/2016 1125   CREATININE 0.71 06/28/2016 1125   CALCIUM 8.5 (L) 06/28/2016 1125   PROT 5.8 (L) 06/28/2016 1125   ALBUMIN 3.0 (L) 06/28/2016 1125   AST 62 (H) 06/28/2016 1125   ALT 53 06/28/2016 1125   ALKPHOS 83 06/28/2016 1125   BILITOT 0.5 06/28/2016 1125   GFRNONAA >60 06/28/2016 1125   GFRAA >60 06/28/2016 1125      Assessment/Plan: 25 y.o. G1P0 at [redacted]w[redacted]d Severe preeclampsia with significant right upper quadrant pain and tripling of LFTs in the last 48 hours. Given that patient is steroid complete, had magnesium over the weekend and has continued right upper quadrant pain in the setting of oligohydramnios and elevated SD Dopplers will move towards delivery. Discussed with patient and her family the option of cervical ripening and attempted vaginal delivery with or without contraction stress test prior to cervical ripening but patient and family are requesting a primary cesarean section. Patient states continued right upper quadrant pain and concerns that fetus will not tolerate contractions given the decelerations seen earlier today and the elevated Dopplers. Curbside consult with Dr. Harlon Flor who agrees with plan total move to delivery.  I discussed with patient increased risk of bleeding,  infection and damage to surrounding organs. We discussed the likelihood that the baby would go to the NICU for prematurity and the possibility that she would need a classical uterine incision which would force her to have C-sections for future pregnancies. Patient expressed agreement towards all of those. Will await coagulation studies and proceed with primary cesarean section.  [name] A. 06/28/2016 2:46 PM    [name] A. 06/28/2016, 2:34 PM

## 2016-06-28 NOTE — Transfer of Care (Signed)
Immediate Anesthesia Transfer of Care Note  Patient: Barbara Pham  Procedure(s) Performed: Procedure(s): CESAREAN SECTION (N/A)  Patient Location: PACU  Anesthesia Type:Spinal  Level of Consciousness: awake  Airway & Oxygen Therapy: Patient Spontanous Breathing  Post-op Assessment: Report given to RN and Post -op Vital signs reviewed and stable  Post vital signs: stable  Last Vitals:  Vitals:   06/28/16 1423 06/28/16 1504  Resp: 17 18  Temp:      Last Pain:  Vitals:   06/28/16 1504  TempSrc:   PainSc: 6          Complications: No apparent anesthesia complications

## 2016-06-28 NOTE — Consult Note (Signed)
Asked by Dr Billy Coast to speak to Mrs Paola to discuss outcome preterm  at 33  Weeks. By history from RN, she is 56 5/7 wks with preeclampsia.  She has received 2 dosse of betamethasone on 7/27 and 7/28. She is currently being prepared to have a C/S for nonreassuring tracing.  I spoke to Mrs Reller and her husband in her room. Her sisters were present with her approval. I discussed our presence at delivery and talked about providing resp support for the baby as needed. I discussed respiratory complications associated with prematurity and possible need for vent and  surfactant if RDS is present. I discussed nutrition, IV and umbilical lines. I also discussed breast feeding and benefits especially to a preterm baby.   I answered their questions fully.  I spent 20 minutes with this consult, more than 50% of the time was with face-to-face counseling with Mr and Mrs Raitz.   Lucillie Garfinkel, MD  Neonatologist

## 2016-06-29 ENCOUNTER — Encounter (HOSPITAL_COMMUNITY): Payer: Self-pay | Admitting: Obstetrics and Gynecology

## 2016-06-29 LAB — COMPREHENSIVE METABOLIC PANEL
ALT: 70 U/L — ABNORMAL HIGH (ref 14–54)
ANION GAP: 5 (ref 5–15)
AST: 58 U/L — ABNORMAL HIGH (ref 15–41)
Albumin: 2.4 g/dL — ABNORMAL LOW (ref 3.5–5.0)
Alkaline Phosphatase: 65 U/L (ref 38–126)
BUN: 16 mg/dL (ref 6–20)
CHLORIDE: 100 mmol/L — AB (ref 101–111)
CO2: 26 mmol/L (ref 22–32)
Calcium: 6.7 mg/dL — ABNORMAL LOW (ref 8.9–10.3)
Creatinine, Ser: 0.84 mg/dL (ref 0.44–1.00)
Glucose, Bld: 121 mg/dL — ABNORMAL HIGH (ref 65–99)
POTASSIUM: 4.4 mmol/L (ref 3.5–5.1)
Sodium: 131 mmol/L — ABNORMAL LOW (ref 135–145)
Total Bilirubin: 0.3 mg/dL (ref 0.3–1.2)
Total Protein: 5 g/dL — ABNORMAL LOW (ref 6.5–8.1)

## 2016-06-29 LAB — CBC
HCT: 29.5 % — ABNORMAL LOW (ref 36.0–46.0)
Hemoglobin: 10.2 g/dL — ABNORMAL LOW (ref 12.0–15.0)
MCH: 31.4 pg (ref 26.0–34.0)
MCHC: 34.6 g/dL (ref 30.0–36.0)
MCV: 90.8 fL (ref 78.0–100.0)
PLATELETS: 150 10*3/uL (ref 150–400)
RBC: 3.25 MIL/uL — ABNORMAL LOW (ref 3.87–5.11)
RDW: 13.5 % (ref 11.5–15.5)
WBC: 17.1 10*3/uL — AB (ref 4.0–10.5)

## 2016-06-29 LAB — MAGNESIUM: Magnesium: 6.5 mg/dL (ref 1.7–2.4)

## 2016-06-29 MED ORDER — OXYCODONE-ACETAMINOPHEN 5-325 MG PO TABS
1.0000 | ORAL_TABLET | ORAL | Status: DC | PRN
  Administered 2016-06-29 – 2016-06-30 (×4): 1 via ORAL
  Administered 2016-06-30 – 2016-07-01 (×6): 2 via ORAL
  Filled 2016-06-29: qty 1
  Filled 2016-06-29 (×2): qty 2
  Filled 2016-06-29: qty 1
  Filled 2016-06-29 (×2): qty 2
  Filled 2016-06-29: qty 1
  Filled 2016-06-29 (×2): qty 2
  Filled 2016-06-29: qty 1

## 2016-06-29 MED ORDER — SODIUM CHLORIDE 0.9% FLUSH: 3.0000 mL | INTRAVENOUS | Status: DC | PRN

## 2016-06-29 MED ORDER — SODIUM CHLORIDE 0.9% FLUSH
3.0000 mL | Freq: Two times a day (BID) | INTRAVENOUS | Status: DC
  Administered 2016-06-29 – 2016-07-01 (×3): 3 mL via INTRAVENOUS

## 2016-06-29 NOTE — Lactation Note (Signed)
This note was copied from a baby's chart. Lactation Consultation Note  Patient Name: Barbara Pham [id number] Date: 06/29/2016 Reason for consult: Initial assessment;NICU baby Initial visit made.  Breastfeeding consultation services/support and Providing Breastmilk For My Baby In NICU booklets given and reviewed.  This is parents first baby.  Baby is now 32 hours old and mom has pumped a few times and obtained drops.  Reviewed pump function using initiation setting, schedule, cleaning and EBM storage.  Mom does not have a pump at home.  Recommended she call her insurance company and informed of 2 week rental.  Instructed mom on hand expression and she returned demonstration obtaining drops.  Baby [name] is doing well and mom is anxious to hold her.  Encouraged to call for assist/concerns prn. Maternal Data    Feeding    LATCH Score/Interventions                      Lactation Tools Discussed/Used Pump Review: Setup, frequency, and cleaning;Milk Storage Initiated by:: RN Date initiated:: 06/28/16   Consult Status Consult Status: Follow-up Date: 06/30/16 Follow-up type: In-patient    Huston Foley 06/29/2016, 10:14 AM

## 2016-06-29 NOTE — Progress Notes (Addendum)
Patient ID: Barbara Pham, female   DOB: Apr 19, 1991, 25 y.o.   MRN: [id number] Patient not in room at time of CNM rounds. Notified by staff that patient was in the NICU with infant. CNM will return later to round on patient.  Raelyn Mora, M MSN, CNM 06/29/2016 10:40 AM

## 2016-06-29 NOTE — Anesthesia Postprocedure Evaluation (Signed)
Anesthesia Post Note  Patient: Barbara Pham  Procedure(s) Performed: Procedure(s) (LRB): CESAREAN SECTION (N/A)  Patient location during evaluation: Mother Baby Anesthesia Type: Spinal Level of consciousness: awake and alert and oriented Pain management: satisfactory to patient Vital Signs Assessment: post-procedure vital signs reviewed and stable Respiratory status: spontaneous breathing and nonlabored ventilation Cardiovascular status: stable Postop Assessment: no headache, no backache, patient able to bend at knees, no signs of nausea or vomiting and adequate PO intake Anesthetic complications: no     Last Vitals:  Vitals:   06/29/16 0504 06/29/16 0604  BP: (!) 123/95 117/78  Pulse: 64 62  Resp: 17 16  Temp: 36.7 C     Last Pain:  Vitals:   06/29/16 0504  TempSrc: Oral  PainSc:    Pain Goal:                 [name]

## 2016-06-29 NOTE — Addendum Note (Signed)
Addendum  created 06/29/16 0836 by Shanon Payor, CRNA   Sign clinical note

## 2016-06-29 NOTE — Progress Notes (Signed)
PT transferred to room 306

## 2016-06-29 NOTE — Progress Notes (Signed)
PPD 1  S: Feels well/ PEC symptoms: minimal RUQ pain, minimal incisional pain, responding to IV narcotics. Up to NICU/ ambulating. Foley in. NO flatus yet but tolerating po. No nausea/ emesis. Minimal vaginal bleeding. Pt notes some blurry vision. Pt notes no HA, no CP, no SOB.   Newborn info: NICU, room air   O: A&O x 3 / VS Blood pressure 125/84, pulse 86, temperature 98.4 F (36.9 C), temperature source Oral, resp. rate 18, height 5\' 1"  (1.549 m), weight 59.9 kg (132 lb), last menstrual period 11/19/2015, SpO2 99 %, unknown if currently breastfeeding.  Labs:  CBC    Component Value Date/Time   WBC 17.1 (H) 06/29/2016 0532   RBC 3.25 (L) 06/29/2016 0532   HGB 10.2 (L) 06/29/2016 0532   HCT 29.5 (L) 06/29/2016 0532   PLT 150 06/29/2016 0532   MCV 90.8 06/29/2016 0532   MCH 31.4 06/29/2016 0532   MCHC 34.6 06/29/2016 0532   RDW 13.5 06/29/2016 0532   LYMPHSABS 3.6 10/08/2014 1445   MONOABS 0.5 10/08/2014 1445   EOSABS 0.1 10/08/2014 1445   BASOSABS 0.1 10/08/2014 1445    CMP Latest Ref Rng & Units 06/29/2016 06/28/2016 06/28/2016  Glucose 65 - 99 mg/dL 224(M) 250(I) 72  BUN 6 - 20 mg/dL 16 14 16   Creatinine 0.44 - 1.00 mg/dL 3.70 4.88 8.91  Sodium 135 - 145 mmol/L 131(L) 129(L) 134(L)  Potassium 3.5 - 5.1 mmol/L 4.4 4.5 4.3  Chloride 101 - 111 mmol/L 100(L) 99(L) 104  CO2 22 - 32 mmol/L 26 22 20(L)  Calcium 8.9 - 10.3 mg/dL 6.7(L) 7.3(L) 8.5(L)  Total Protein 6.5 - 8.1 g/dL 5.0(L) 5.4(L) 5.8(L)  Total Bilirubin 0.3 - 1.2 mg/dL 0.3 0.4 0.5  Alkaline Phos 38 - 126 U/L 65 75 83  AST 15 - 41 U/L 58(H) 96(H) 62(H)  ALT 14 - 54 U/L 70(H) 91(H) 53     I&O: I/O last 3 completed shifts: In: 5719.2 [P.O.:1430; I.V.:4289.2] Out: 5025 [Urine:4525; Blood:500]   Total I/O In: 360 [P.O.:360] Out: 100 [Urine:100]    Abdomen: soft distension, + tympany, mild RUQ pain. Incision dressed, no staining  Perineum/Lochia: minimal lochia  Extremities: trace edema, 3+ DTR, no clonus  A/P: POD#1  s/p PCS at 31 wks for severe PEC with abnl LFTs and RUQ pain. - PEC. Pt still on Mag. Diuresing well with aggressive output but will cont Mag and re-eval at 24 hrs post-op. Pt notes blurry vision but appropriate Mag level this am and still with 3+ DTR. No SOB. bp's stable and no antihypertensives needed. RUQ pain improving, LFTs trending down. Will repeat in am. - Mild Anemia.  - Baby in NICU doing well.   Barbara Pham A. 06/29/2016 9:31 PM

## 2016-06-30 LAB — COMPREHENSIVE METABOLIC PANEL
ALT: 43 U/L (ref 14–54)
AST: 28 U/L (ref 15–41)
Albumin: 2.4 g/dL — ABNORMAL LOW (ref 3.5–5.0)
Alkaline Phosphatase: 62 U/L (ref 38–126)
Anion gap: 5 (ref 5–15)
BUN: 16 mg/dL (ref 6–20)
CHLORIDE: 106 mmol/L (ref 101–111)
CO2: 26 mmol/L (ref 22–32)
CREATININE: 0.8 mg/dL (ref 0.44–1.00)
Calcium: 6.6 mg/dL — ABNORMAL LOW (ref 8.9–10.3)
GFR calc non Af Amer: >60 mL/min (ref >60)
Glucose, Bld: 95 mg/dL (ref 65–99)
Potassium: 3.8 mmol/L (ref 3.5–5.1)
SODIUM: 137 mmol/L (ref 135–145)
Total Bilirubin: 0.4 mg/dL (ref 0.3–1.2)
Total Protein: 4.8 g/dL — ABNORMAL LOW (ref 6.5–8.1)

## 2016-06-30 LAB — RPR: RPR Ser Ql: NONREACTIVE

## 2016-06-30 NOTE — Lactation Note (Signed)
This note was copied from a baby's chart. Lactation Consultation Note  Patient Name: Barbara Pham [id number] Date: 06/30/2016  Mom is pumping and obtaining small amounts of colostrum.  Encouraged to continue regular pumping and hand expression.  Will follow up tomorrow.   Maternal Data    Feeding Feeding Type: Donor Breast Milk Length of feed: 15 min  LATCH Score/Interventions                      Lactation Tools Discussed/Used     Consult Status      Huston Foley 06/30/2016, 11:43 AM

## 2016-06-30 NOTE — Progress Notes (Signed)
Patient ID: Barbara Pham, female   DOB: 1991-02-06, 25 y.o.   MRN: [id number] Subjective: POD# 2 Information for the patient's newborn:  Alfa, Leibensperger [[id number]]  female NICU, room air.  Reports feeling well, gas pain worse today, no neural s/s of PEC Feeding: breast Patient reports tolerating PO.  Breast symptoms: pumping, some colostrum seen. Pain controlled withPO narcotics Denies HA/SOB/C/P/N/V/dizziness. Flatus present. She reports vaginal bleeding as normal, without clots.  She is ambulating, urinating without difficult.     Objective:   VS:  Vitals:   06/30/16 0128 06/30/16 0500 06/30/16 0504 06/30/16 1230  BP: 113/77 118/75  123/81  Pulse: 72 86  77  Resp: 18 20  20   Temp: 98.3 F (36.8 C) 98.2 F (36.8 C)  97.6 F (36.4 C)  TempSrc: Oral Oral  Oral  SpO2: 97% 100%  100%  Weight:   60.3 kg (133 lb)   Height:         Intake/Output Summary (Last 24 hours) at 06/30/16 1246 Last data filed at 06/30/16 1215  Gross per 24 hour  Intake           1570.5 ml  Output             4200 ml  Net          -2629.5 ml        Recent Labs  06/28/16 1125 06/28/16 1413 06/29/16 0532  WBC 17.3*  --  17.1*  HGB 11.8*  --  10.2*  HCT 34.4*  --  29.5*  PLT 184 177 150   Hepatic Function Latest Ref Rng & Units 06/30/2016 06/29/2016 06/28/2016  Total Protein 6.5 - 8.1 g/dL 4.8(L) 5.0(L) 5.4(L)  Albumin 3.5 - 5.0 g/dL 2.4(L) 2.4(L) 2.8(L)  AST 15 - 41 U/L 28 58(H) 96(H)  ALT 14 - 54 U/L 43 70(H) 91(H)  Alk Phosphatase 38 - 126 U/L 62 65 75  Total Bilirubin 0.3 - 1.2 mg/dL 0.4 0.3 0.4    Blood type: --/--/O POS (08/01 1125)  Rubella:   immune    Physical Exam:  General: alert, cooperative and no distress CV: Regular rate and rhythm Resp: clear Abdomen: soft, nontender, normal bowel sounds Incision: dry, intact and serous and minimal drainage present Uterine Fundus: firm, below umbilicus, nontender Lochia: minimal Ext: extremities normal, atraumatic, no cyanosis or  edema and no edema, redness or tenderness in the calves or thighs      Assessment/Plan: 25 y.o.   POD# 2. [id number]                  Principal Problem:   Postpartum care following cesarean delivery (8/1) Active Problems:   Severe preeclampsia - resolving - BP normotensive mostly, no neural s/s of PEC - LFT's trending down past 2 days Doing well, stable.               Advance diet as tolerated Ambulate Routine post-op care Anticipate DC in [place], CNM 06/30/2016, 12:46 PM

## 2016-07-01 MED ORDER — OXYCODONE-ACETAMINOPHEN 5-325 MG PO TABS: 1.0000 | ORAL_TABLET | ORAL | 0 refills | Status: DC | PRN

## 2016-07-01 MED ORDER — IBUPROFEN 600 MG PO TABS: 600.0000 mg | ORAL_TABLET | Freq: Four times a day (QID) | ORAL | 0 refills | Status: DC

## 2016-07-01 NOTE — Progress Notes (Signed)
Pt discharged home with husband.  Discharge instructions reviewed earlier in afternoon with pt, her mother-in-law and her sister-in-law together in room.  Pt ambulated to car with K. Nilda Calamity, NT. Pt home with rented breast pump from lactation department.  No other equipment for home ordered at discharge.

## 2016-07-01 NOTE — Discharge Summary (Signed)
POSTOPERATIVE DISCHARGE SUMMARY:  Patient ID: Barbara Pham MRN: [id number] DOB/AGE: [birth year] 25 y.o.  Admit date: 06/28/2016 Admission Diagnoses: 31.5 weeks / severe preeclampsia  Discharge date:   Discharge Diagnoses: POD 3 s/p cesarean section / severe preeclampsia resolving  Prenatal history: G1P0101   EDC : 08/25/2016, Alternate EDD Entry  Prenatal care at [facility]  Primary provider : Alice Reichert Prenatal course complicated by preeclampsia  Prenatal Labs: ABO, Rh: --/--/O POS (08/01 1125) Antibody: NEG (08/01 1125) Rubella:   Immune RPR: Non Reactive (08/03 0516)  HBsAg:    HIV:    GBS:     Medical / Surgical History :  Past medical history:  Past Medical History:  Diagnosis Date  . Acid reflux   . Cat allergies   . Multiple allergies    dust and pollen  . Pollen allergies   . Postpartum care following cesarean delivery (8/1) 06/29/2016    Past surgical history:  Past Surgical History:  Procedure Laterality Date  . CESAREAN SECTION N/A 06/28/2016   Procedure: CESAREAN SECTION;  Surgeon: Noland Fordyce, MD;  Location: [facility] BIRTHING SUITES;  Service: Obstetrics;  Laterality: N/A;  . feet surgery     had to stretch bones due to growth plates closing too soon  . TONSILLECTOMY AND ADENOIDECTOMY      Family History: History reviewed. No pertinent family history.  Social History:  reports that she has never smoked. She has never used smokeless tobacco. She reports that she drinks alcohol. She reports that she does not use drugs.  Allergies: Review of patient's allergies indicates no known allergies.   Current Medications at time of admission:  Prior to Admission medications   Medication Sig Start Date End Date Taking? Authorizing Provider  omeprazole (PRILOSEC) 20 MG capsule Take 20 mg by mouth daily as needed (For heartburn.).   Yes Historical Provider, MD  Prenatal Vit-Fe Fumarate-FA (PRENATAL MULTIVITAMIN) TABS tablet Take 1 tablet by mouth  at bedtime.    Yes Historical Provider, MD  Wheat Dextrin-Calcium ([employer] EASY FIBER/CALCIUM PO) Take 1 tablet by mouth daily.   Yes Historical Provider, MD  Doxylamine-Pyridoxine (DICLEGIS) 10-10 MG TBEC Take two tablets by mouth at bedtime as needed for morning sickness Patient not taking: Reported on 06/23/2016 01/11/16   [name], NP  ibuprofen (ADVIL,MOTRIN) 600 MG tablet Take 1 tablet (600 mg total) by mouth every 6 (six) hours. 07/01/16   Marlinda Mike, CNM  oxyCODONE-acetaminophen (PERCOCET/ROXICET) 5-325 MG tablet Take 1-2 tablets by mouth every 4 (four) hours as needed for moderate pain. 07/01/16   Marlinda Mike, CNM    Intrapartum Course:  Admit for cesarean section delivery due to worsening status and abnormal labs / S/P BM course last week  Procedures: Cesarean section delivery on 07/01/2016 with delivery of  viable newborn by Dr Ernestina Penna   See operative report for further details APGAR (1 MIN): 8   APGAR (5 MINS): 9    Postoperative / postpartum course:  Uncomplicated with discharge on POD 3   Discharge Instructions:  Discharged Condition: stable  Activity: pelvic rest and postoperative restrictions x 2   Diet: routine  Medications:    Medication List    TAKE these medications   [employer] EASY FIBER/CALCIUM PO Take 1 tablet by mouth daily.   Doxylamine-Pyridoxine 10-10 MG Tbec Commonly known as:  DICLEGIS Take two tablets by mouth at bedtime as needed for morning sickness   ibuprofen 600 MG tablet Commonly known as:  ADVIL,MOTRIN Take 1  tablet (600 mg total) by mouth every 6 (six) hours.   omeprazole 20 MG capsule Commonly known as:  PRILOSEC Take 20 mg by mouth daily as needed (For heartburn.).   oxyCODONE-acetaminophen 5-325 MG tablet Commonly known as:  PERCOCET/ROXICET Take 1-2 tablets by mouth every 4 (four) hours as needed for moderate pain.   prenatal multivitamin Tabs tablet Take 1 tablet by mouth at bedtime.       Wound Care: keep clean and dry /  remove honeycomb POD 5 Postpartum Instructions: [facility] discharge booklet - instructions reviewed  Discharge to: Home  Follow up :  [facility] in 1 week for interval visit with nurse for BP recheck [facility] in 6 weeks for routine postpartum visit with Dr Ernestina Penna                Signed: Marlinda Mike CNM, MSN, [facility] 07/01/2016, 1:13 PM

## 2016-07-01 NOTE — Progress Notes (Signed)
POSTOPERATIVE DAY # 3 S/P CS - severe preeclampsia  11:12 AM - not in room / visiting in NICU                    will revisit this PM                 O: VS: BP 123/78 (BP Location: Left Arm)   Pulse 81   Temp 97.5 F (36.4 C) (Oral)   Resp 18   Ht 5\' 1"  (1.549 m)   Wt 61.3 kg (135 lb 4 oz)   LMP 11/19/2015   SpO2 99%   Breastfeeding? Unknown   BMI 25.56 kg/m    LABS:               Recent Labs  06/28/16 1125 06/28/16 1413 06/29/16 0532  WBC 17.3*  --  17.1*  HGB 11.8*  --  10.2*  PLT 184 177 150               Bloodtype: --/--/O POS (08/01 1125)                                  I&O: Intake/Output      08/03 0701 - 08/04 0700 08/04 0701 - 08/05 0700   P.O.     I.V. (mL/kg) 3 (0) 3 (0)   Total Intake(mL/kg) 3 (0) 3 (0)   Urine (mL/kg/hr) 1300 (0.9)    Total Output 1300     Net -1297 +3                 Marlinda Mike CNM, MSN, FACNM 07/01/2016, 11:12 AM

## 2016-07-01 NOTE — Progress Notes (Signed)
POSTOPERATIVE DAY # 3 S/P CS - severe preeclampsia   S:         Reports feeling well / no PIH symptoms             Tolerating po intake / no nausea / no vomiting / + flatus / + BM             Bleeding is light             Pain controlled with motrin and percocet             Up ad lib / ambulatory/ voiding QS  Newborn stable in NICU - plans to Breastfeed feeding   O:  VS: BP (!) 124/93 (BP Location: Right Arm)   Pulse 89   Temp 98.7 F (37.1 C) (Oral)   Resp 18   Ht 5\' 1"  (1.549 m)   Wt 61.3 kg (135 lb 4 oz)   LMP 11/19/2015   SpO2 99%   Breastfeeding? Unknown   BMI 25.56 kg/m                Labile BP -visited in NICU all morning    LABS:               Recent Labs  06/28/16 1413 06/29/16 0532  WBC  --  17.1*  HGB  --  10.2*  PLT 177 150               Bloodtype: --/--/O POS (08/01 1125)                         I&O: Intake/Output      08/03 0701 - 08/04 0700 08/04 0701 - 08/05 0700   P.O.     I.V. (mL/kg) 3 (0) 3 (0)   Total Intake(mL/kg) 3 (0) 3 (0)   Urine (mL/kg/hr) 1300 (0.9)    Total Output 1300     Net -1297 +3                   Physical Exam:             Alert and Oriented X3  Lungs: Clear and unlabored  Heart: regular rate and rhythm / no mumurs  Abdomen: soft, non-tender, non-distended, active BS             Fundus: firm, non-tender, U-2             Dressing intact honeycomb              Incision:  approximated with suture / no erythema / no ecchymosis / no drainage  Perineum: intact  Lochia: light  Extremities: trace edema, no calf pain or tenderness, negative Homans  A:        POD # 3 S/P Cs            Severe preeclampsia - resolving  P:        Routine postoperative care              DC home              BP recheck Monday at [facility]     Marlinda Mike CNM, MSN, [facility] 07/01/2016, 1:09 PM

## 2016-07-01 NOTE — Lactation Note (Signed)
This note was copied from a baby's chart. Lactation Consultation Note  Patient Name: Girl Mystie Delhierro [id number] Date: 07/01/2016 Reason for consult: Follow-up assessment;NICU baby;Infant < 6lbs   Follow up with first time mom of 42 hour old infant. Infant is feeding per mom. Mom reports she is pumping every 2 hours and volumes are increasing. She is feeling fuller today and is pleased to see more volume. There are 8-10 colostrum bullets in the refrigerator with small amounts, enc mom to take to NICU when she goes to visit [place]. Mom wants to rent a pump for 2 weeks, pump rental paperwork given and mom to call when she is ready for pump. She has bottles and labels for home use. Follow up later for pump rental.   Maternal Data Formula Feeding for Exclusion: No Has patient been taught Hand Expression?: Yes Does the patient have breastfeeding experience prior to this delivery?: No  Feeding Feeding Type: Breast Milk with Formula added Length of feed: 15 min  LATCH Score/Interventions                      Lactation Tools Discussed/Used WIC Program: No Pump Review: Setup, frequency, and cleaning;Milk Storage   Consult Status Consult Status: PRN Follow-up type: Call as needed    Ed Blalock 07/01/2016, 9:27 AM

## 2016-07-12 ENCOUNTER — Ambulatory Visit: Payer: Self-pay

## 2016-07-12 NOTE — Lactation Note (Signed)
This note was copied from a baby's chart. Lactation Consultation Note  Patient Name: Girl Fredna DowDarcy Cooley [id number]'s Date: 07/12/2016 Reason for consult: Follow-up assessment;NICU baby  NICU baby 492 weeks old. Mom reports that she was getting about 2 ounces when she pumped, and now she is getting 1 ounce. Mom reports that she did not pump for 10 hours last night, and then did not pump for 4-5 hours earlier today. Discussed with mom that she needs to pump at least 8 times/24 hours followed by hand expression. Discussed the importance of first 2 weeks to breast milk supply. Enc mom to think of pumping as replacing what the baby would be doing at the breast--and the baby is eating 8 times a day. Discussed power-pumping at night just before bedtime, and then pumping every 2 hours when mom first gets up. Discussed with mom that the STS and nuzzling she is currently enjoying with the baby is also helpful to increasing breast milk supply. Enc mom to call for assistance as needed.  Maternal Data    Feeding Feeding Type: Breast Milk Length of feed: 30 min  LATCH Score/Interventions                      Lactation Tools Discussed/Used     Consult Status Consult Status: PRN    Sherlyn HayJennifer D Olamae Ferrara 07/12/2016, 3:52 PM

## 2016-07-19 ENCOUNTER — Ambulatory Visit: Payer: Self-pay

## 2016-07-19 NOTE — Lactation Note (Signed)
This note was copied from a baby's chart. Lactation Consultation Note  Patient Name: Barbara Pham [id number]'IToday's Date: 07/19/2016 Reason for consult: Follow-up assessment   With this mom of a NICU baby, now 3 weeks ol;d and 34 5/[redacted] weeks gestation, weight 3 pounds 10 oz.  I assisted mom with latching the baby for the first time. She was latched in a semi football hold. She licked a few time, and then got the hiccups, and yawning. Due to these stress signs, latching stopped, and mom held baby during ng feed. I checked back 30 mins later, and mom was willing to let baby be, and try latching another time.  Mom was concerned about her milk supply. She had stopped pumping at night, not realizing this would decrease her supply. She has added power pumping, and at time get 3 oz, other time 1. I suggested she try and increase her frequency of pumping to every 2 hours, when she can, and to add hand expression after each pumping. Mom was shown how to hand express, and had easily expressed milk. I also advised mom that when holding her baby, to add STS, since this will also increase her milk supply. Dad present, and both parents very receptive to teaching.    Maternal Data    Feeding Feeding Type: Breast Fed Length of feed: 30 min  LATCH Score/Interventions Latch: Too sleepy or reluctant, no latch achieved, no sucking elicited. (baby showing stress cues - hiccups and yawning, so latching stopped, and mom held baby during ng feeding. )  Audible Swallowing: None  Type of Nipple: Everted at rest and after stimulation  Comfort (Breast/Nipple): Soft / non-tender     Hold (Positioning): Assistance needed to correctly position infant at breast and maintain latch. Intervention(s): Breastfeeding basics reviewed;Support Pillows;Position options;Skin to skin  LATCH Score: 5  Lactation Tools Discussed/Used     Consult Status Consult Status: PRN Follow-up type: In-patient (NICU)    Alfred LevinsLee, Hudson Lehmkuhl  Anne 07/19/2016, 5:42 PM

## 2016-07-28 ENCOUNTER — Telehealth (HOSPITAL_COMMUNITY): Payer: Self-pay | Admitting: Lactation Services

## 2016-07-28 NOTE — Telephone Encounter (Signed)
Direct lactation Phone call: This mom called and mentioned her baby was about 1061 month old and [facility].  Going to the breast 2-3 times day and staying latched for about 10 mins and looses interest.  LC explained to mom it may be due to her her decreased supply, also that she is a premie.  Per mom is pumping every 2-3 hours days/evenings, nights at 4 hours with 1 oz to 2 /1/2 oz max with pumping .  Also baby is feeding about every  3-4 hours - 2 oz a feeding/ tolerating well.  And is more interested in feeding at night than days. LC mentioned to mom that is normal - lots of baby's have their  Days and nights twisted around. LC discussed how to gradually shifting the baby's schedule.  Also until the baby is more active with feeding at the breast, latch her at the breast when she seems interested to participate.  Or try giving her an appetizer of 15 -20 ml 1st and then latch at the breast. In the mean time work on increasing milk supply  By continuing to pump and if she has a day where it is difficult to keep to the schedule of every 2- 3 hours pumping , add 1 power pump  For 60 mins ( 10 mins on and 10 off for 60 mins).  Mom asked about taking Fenugreek having a premie. LC recommended Fenugreek was fine, but if she wanted an added boost for MS to  Consider "Lactation Support " . Mom also asked about the Mother milk tea - and LC recommended not more than twice day.  LC recommended once she see her MILK SUPPLY increasing to call and make an LC O/P appt. For a feeding assessment. Mom aware of the phone number.

## 2017-04-12 ENCOUNTER — Encounter: Payer: Self-pay | Admitting: Gynecology

## 2019-11-27 ENCOUNTER — Emergency Department (HOSPITAL_COMMUNITY): Payer: BC Managed Care – PPO

## 2019-11-27 ENCOUNTER — Other Ambulatory Visit: Payer: Self-pay

## 2019-11-27 ENCOUNTER — Encounter (HOSPITAL_COMMUNITY): Payer: Self-pay

## 2019-11-27 ENCOUNTER — Emergency Department (HOSPITAL_COMMUNITY)
Admission: EM | Admit: 2019-11-27 | Discharge: 2019-11-28 | Disposition: A | Discharge status: Home / Self Care | Payer: BC Managed Care – PPO | Attending: Emergency Medicine | Admitting: Emergency Medicine

## 2019-11-27 DIAGNOSIS — R109 Unspecified abdominal pain: Secondary | ICD-10-CM

## 2019-11-27 DIAGNOSIS — R1032 Left lower quadrant pain: Secondary | ICD-10-CM | POA: Insufficient documentation

## 2019-11-27 DIAGNOSIS — R102 Pelvic and perineal pain: Secondary | ICD-10-CM | POA: Diagnosis not present

## 2019-11-27 DIAGNOSIS — N23 Unspecified renal colic: Secondary | ICD-10-CM

## 2019-11-27 LAB — URINALYSIS, ROUTINE W REFLEX MICROSCOPIC
Bilirubin Urine: NEGATIVE
Glucose, UA: NEGATIVE mg/dL
Hgb urine dipstick: NEGATIVE
Ketones, ur: 80 mg/dL — AB
Leukocytes,Ua: NEGATIVE
Nitrite: NEGATIVE
Protein, ur: 30 mg/dL — AB
Specific Gravity, Urine: 1.019 (ref 1.005–1.030)
pH: 6 (ref 5.0–8.0)

## 2019-11-27 LAB — BASIC METABOLIC PANEL
Anion gap: 14 (ref 5–15)
BUN: 13 mg/dL (ref 6–20)
CO2: 16 mmol/L — ABNORMAL LOW (ref 22–32)
Calcium: 9.1 mg/dL (ref 8.9–10.3)
Chloride: 109 mmol/L (ref 98–111)
Creatinine, Ser: 1.29 mg/dL — ABNORMAL HIGH (ref 0.44–1.00)
GFR calc Af Amer: >60 mL/min (ref >60)
GFR calc non Af Amer: 56 mL/min — ABNORMAL LOW (ref >60)
Glucose, Bld: 88 mg/dL (ref 70–99)
Potassium: 3.9 mmol/L (ref 3.5–5.1)
Sodium: 139 mmol/L (ref 135–145)

## 2019-11-27 LAB — CBC
HCT: 42.7 % (ref 36.0–46.0)
Hemoglobin: 13.9 g/dL (ref 12.0–15.0)
MCH: 31 pg (ref 26.0–34.0)
MCHC: 32.6 g/dL (ref 30.0–36.0)
MCV: 95.1 fL (ref 80.0–100.0)
Platelets: 293 10*3/uL (ref 150–400)
RBC: 4.49 MIL/uL (ref 3.87–5.11)
RDW: 12.5 % (ref 11.5–15.5)
WBC: 15.2 10*3/uL — ABNORMAL HIGH (ref 4.0–10.5)
nRBC: 0 % (ref 0.0–0.2)

## 2019-11-27 LAB — I-STAT BETA HCG BLOOD, ED (MC, WL, AP ONLY): I-stat hCG, quantitative: <5 m[IU]/mL (ref <5)

## 2019-11-27 LAB — WET PREP, GENITAL
Sperm: NONE SEEN
Trich, Wet Prep: NONE SEEN
Yeast Wet Prep HPF POC: NONE SEEN

## 2019-11-27 MED ORDER — ONDANSETRON HCL 4 MG/2ML IJ SOLN
4.0000 mg | Freq: Once | INTRAMUSCULAR | Status: AC
  Administered 2019-11-27: 4 mg via INTRAVENOUS
  Filled 2019-11-27: qty 2

## 2019-11-27 MED ORDER — MORPHINE SULFATE (PF) 4 MG/ML IV SOLN
4.0000 mg | Freq: Once | INTRAVENOUS | Status: AC
  Administered 2019-11-27: 4 mg via INTRAVENOUS
  Filled 2019-11-27: qty 1

## 2019-11-27 MED ORDER — PROMETHAZINE HCL 25 MG/ML IJ SOLN
25.0000 mg | Freq: Once | INTRAMUSCULAR | Status: AC
  Administered 2019-11-27: 23:00:00 25 mg via INTRAVENOUS
  Filled 2019-11-27: qty 1

## 2019-11-27 MED ORDER — FENTANYL CITRATE (PF) 100 MCG/2ML IJ SOLN
50.0000 ug | INTRAMUSCULAR | Status: DC | PRN
  Administered 2019-11-27: 50 ug via INTRAVENOUS
  Filled 2019-11-27 (×2): qty 2

## 2019-11-27 MED ORDER — SODIUM CHLORIDE 0.9 % IV SOLN: Freq: Once | INTRAVENOUS | Status: AC

## 2019-11-27 NOTE — ED Triage Notes (Signed)
Pt arrives POV for eval of L sided flank pain onset Monday. Seen by urology and GYN who have concern for obstructing stone. Pt reports she is unable to keep anything down. Pt appears markedly uncomfortable in triage. Tearful

## 2019-11-27 NOTE — ED Notes (Signed)
Patient transported to CT 

## 2019-11-27 NOTE — ED Provider Notes (Signed)
[facility] Provider Note   CSN: [id number] Arrival date & time: 11/27/19  1549     History Chief Complaint  Patient presents with  . Flank Pain    Zada FindersDarcy L Davee is a 28 y.o. female.  HPI  Patient is a 28 year old female with history of C-section 3 years ago without complication.  With no other significant pertinent past medical history presents today with 7 days of left flank pain and left calf pain.  Patient states that she notes the pain began 3 days ago with certain achy left-sided pelvic discomfort.  Patient states that her symptoms worsened and her pain is now located in left flank radiating to her left pelvis.  Is 10/10, severe, constant but waxing and waning there is an achy constant pain with sharp spasms left-sided flank pain may come and go with no identifiable aggravating or mitigating factors.   Patient states that 5 days ago she started having these sharp spasmodic left flank pain that [facility] she has had a CT and ultrasound done of her abdomen pelvis.  These were notable for right-sided kidney stone that was nonobstructive approximately 3 mm in size.  Patient was given symptomatic control in the form of Percocet and discharged with urology follow-up.  Patient states she followed up with urologist 2 days ago in clinic and had a KUB done that showed a right-sided kidney stone.  Urology told her that she is likely constipated and discharged with MiraLAX.  Patient states that she took MiraLAX last 2 days without benefit and noticed that she has not had a bowel movement today however she states that is normal for her to have a bowel movement every other day.  States her last bowel movement was mildly hard.  Patient states she swallowed her gynecologist yesterday who placed her on fluconazole and give her Zofran.  Her OB/GYN suspect that this is a kidney stone and recommend she follow back up with urology.   Patient called her urologist today who recommended she be seen in the ED.  Patient denies any blood in her stool.  States she vomited once or twice with some green vomit denies any bloody feces in her vomit.  Denies any hematochezia or melena.  Denies any vaginal discharge, vaginal pain or itching.  Denies any concern for respiratory disease.  Denies any fevers or chills.  Also denies any cough cold congestion, shortness of breath or chest pain.     Past Medical History:  Diagnosis Date  . Acid reflux   . Cat allergies   . Multiple allergies    dust and pollen  . Pollen allergies   . Postpartum care following cesarean delivery (8/1) 06/29/2016    Patient Active Problem List   Diagnosis Date Noted  . Postpartum care following cesarean delivery (8/1) 06/29/2016  . Severe preeclampsia 06/28/2016  . Preeclampsia 06/23/2016  . Reflux 05/07/2012    Past Surgical History:  Procedure Laterality Date  . CESAREAN SECTION N/A 06/28/2016   Procedure: CESAREAN SECTION;  Surgeon: Noland FordyceKelly Fogleman, MD;  [facility];  Service: Obstetrics;  Laterality: N/A;  . feet surgery     had to stretch bones due to growth plates closing too soon  . TONSILLECTOMY AND ADENOIDECTOMY       OB History    Gravida  1   Para  1   Term      Preterm  1   AB  Living  1     SAB      TAB      Ectopic      Multiple  0   Live Births  1           History reviewed. No pertinent family history.  Social History   Tobacco Use  . Smoking status: Never Smoker  . Smokeless tobacco: Never Used  Substance Use Topics  . Alcohol use: Yes    Comment: occassionl  . Drug use: No    Home Medications Prior to Admission medications   Medication Sig Start Date End Date Taking? Authorizing Provider  Doxylamine-Pyridoxine (DICLEGIS) 10-10 MG TBEC Take two tablets by mouth at bedtime as needed for morning sickness Patient not taking: Reported on 06/23/2016 01/11/16   [name], NP    ibuprofen (ADVIL,MOTRIN) 600 MG tablet Take 1 tablet (600 mg total) by mouth every 6 (six) hours. 07/01/16   Marlinda Mike, CNM  omeprazole (PRILOSEC) 20 MG capsule Take 20 mg by mouth daily as needed (For heartburn.).    [provider]  oxyCODONE-acetaminophen (PERCOCET/ROXICET) 5-325 MG tablet Take 1-2 tablets by mouth every 4 (four) hours as needed for moderate pain. 07/01/16   Marlinda Mike, CNM  Prenatal Vit-Fe Fumarate-FA (PRENATAL MULTIVITAMIN) TABS tablet Take 1 tablet by mouth at bedtime.     [provider]  Wheat Dextrin-Calcium ([employer] EASY FIBER/CALCIUM PO) Take 1 tablet by mouth daily.    [provider]    Allergies    Patient has no known allergies.  Review of Systems   Review of Systems  Constitutional: Negative for chills and fever.  HENT: Negative for congestion.   Eyes: Negative for pain.  Respiratory: Negative for cough and shortness of breath.   Cardiovascular: Negative for chest pain and leg swelling.  Gastrointestinal: Positive for abdominal distention, abdominal pain, constipation, nausea and vomiting.  Genitourinary: Positive for decreased urine volume, flank pain and frequency. Negative for dysuria, vaginal bleeding, vaginal discharge and vaginal pain.  Musculoskeletal: Negative for myalgias.  Skin: Negative for rash.  Neurological: Negative for dizziness and headaches.    Physical Exam Updated Vital Signs BP 118/90 (BP Location: Right Arm)   Pulse 81   Temp (!) 97.5 F (36.4 C) (Oral)   Resp 18   Ht  (1.6 m)   Wt 56.7 kg   SpO2 100%   BMI 22.14 kg/m   Physical Exam Vitals and nursing note reviewed.  Constitutional:      General: She is in acute distress.  HENT:     Head: Normocephalic and atraumatic.     Nose: Nose normal.     Mouth/Throat:     Mouth: Mucous membranes are dry.  Eyes:     General: No scleral icterus. Cardiovascular:     Rate and Rhythm: Normal rate and regular rhythm.     Pulses: Normal pulses.      Heart sounds: Normal heart sounds.  Pulmonary:     Effort: Pulmonary effort is normal. No respiratory distress.     Breath sounds: No wheezing.  Abdominal:     Palpations: Abdomen is soft.     Tenderness: There is abdominal tenderness (Left lower quadrant and left pelvic tenderness to palpation). There is left CVA tenderness. There is no right CVA tenderness.     Comments: Positive left-sided CVA tenderness  Genitourinary:    Comments: Vulva without lesions or abnormality Vaginal canal without abnormal discharge or lesion Cervix appears normal, is closed  No CMT.  Left adnexa with tenderness to palpation. Musculoskeletal:     Cervical back: Normal range of motion.     Right lower leg: No edema.     Left lower leg: No edema.  Skin:    General: Skin is warm and dry.     Capillary Refill: Capillary refill takes less than 2 seconds.  Neurological:     Mental Status: She is alert. Mental status is at baseline.  Psychiatric:        Mood and Affect: Mood normal.        Behavior: Behavior normal.     ED Results / Procedures / Treatments   Labs (all labs ordered are listed, but only abnormal results are displayed) Labs Reviewed  WET PREP, GENITAL - Abnormal; Notable for the following components:      Result Value   Clue Cells Wet Prep HPF POC PRESENT (*)    WBC, Wet Prep HPF POC MODERATE (*)    All other components within normal limits  URINALYSIS, ROUTINE W REFLEX MICROSCOPIC - Abnormal; Notable for the following components:   Color, Urine STRAW (*)    Ketones, ur 80 (*)    Protein, ur 30 (*)    Bacteria, UA RARE (*)    All other components within normal limits  BASIC METABOLIC PANEL - Abnormal; Notable for the following components:   CO2 16 (*)    Creatinine, Ser 1.29 (*)    GFR calc non Af Amer 56 (*)    All other components within normal limits  CBC - Abnormal; Notable for the following components:   WBC 15.2 (*)    All other components within normal limits  URINE  CULTURE  I-STAT BETA HCG BLOOD, ED ([facility], [facility], [facility] ONLY)  GC/CHLAMYDIA PROBE AMP ([place]) NOT AT [facility]    EKG None  Radiology US Transvaginal Non-OB  Result Date: 11/27/2019 CLINICAL DATA:  Initial evaluation for acute left adnexal tenderness. EXAM: TRANSABDOMINAL AND TRANSVAGINAL ULTRASOUND OF PELVIS DOPPLER ULTRASOUND OF OVARIES TECHNIQUE: Both transabdominal and transvaginal ultrasound examinations of the pelvis were performed. Transabdominal technique was performed for global imaging of the pelvis including uterus, ovaries, adnexal regions, and pelvic cul-de-sac. It was necessary to proceed with endovaginal exam following the transabdominal exam to visualize the uterus, endometrium, and ovaries. Color and duplex Doppler ultrasound was utilized to evaluate blood flow to the ovaries. COMPARISON:  None available. FINDINGS: Uterus Measurements: 7.8 x 3.1 x 4.1 cm = volume: 51.3 mL. No fibroids or other mass visualized. Endometrium Thickness: 5 mm.  No focal abnormality visualized. Right ovary Measurements: 3.0 x 1.9 x 2.8 cm = volume: 8.4 mL. Normal appearance/no adnexal mass. Left ovary Measurements: 2.9 x 2.1 x 2.1 cm = volume: 6.6 mL. Normal appearance/no adnexal mass. Pulsed Doppler evaluation of both ovaries demonstrates normal low-resistance arterial and venous waveforms. Other findings No abnormal free fluid. IMPRESSION: Normal pelvic ultrasound. No evidence for torsion or other acute abnormality. Electronically Signed   By: Jeannine Boga M.D.   On: 11/27/2019 22:34   US Pelvis Complete  Result Date: 11/27/2019 CLINICAL DATA:  Initial evaluation for acute left adnexal tenderness. EXAM: TRANSABDOMINAL AND TRANSVAGINAL ULTRASOUND OF PELVIS DOPPLER ULTRASOUND OF OVARIES TECHNIQUE: Both transabdominal and transvaginal ultrasound examinations of the pelvis were performed. Transabdominal technique was performed for global imaging of the pelvis including uterus, ovaries, adnexal regions, and  pelvic cul-de-sac. It was necessary to proceed with endovaginal exam following the transabdominal exam to visualize the uterus, endometrium, and  ovaries. Color and duplex Doppler ultrasound was utilized to evaluate blood flow to the ovaries. COMPARISON:  None available. FINDINGS: Uterus Measurements: 7.8 x 3.1 x 4.1 cm = volume: 51.3 mL. No fibroids or other mass visualized. Endometrium Thickness: 5 mm.  No focal abnormality visualized. Right ovary Measurements: 3.0 x 1.9 x 2.8 cm = volume: 8.4 mL. Normal appearance/no adnexal mass. Left ovary Measurements: 2.9 x 2.1 x 2.1 cm = volume: 6.6 mL. Normal appearance/no adnexal mass. Pulsed Doppler evaluation of both ovaries demonstrates normal low-resistance arterial and venous waveforms. Other findings No abnormal free fluid. IMPRESSION: Normal pelvic ultrasound. No evidence for torsion or other acute abnormality. Electronically Signed   By: Rise Mu M.D.   On: 11/27/2019 22:34   [place] Art/Ven Flow Abd Pelv Doppler  Result Date: 11/27/2019 CLINICAL DATA:  Initial evaluation for acute left adnexal tenderness. EXAM: TRANSABDOMINAL AND TRANSVAGINAL ULTRASOUND OF PELVIS DOPPLER ULTRASOUND OF OVARIES TECHNIQUE: Both transabdominal and transvaginal ultrasound examinations of the pelvis were performed. Transabdominal technique was performed for global imaging of the pelvis including uterus, ovaries, adnexal regions, and pelvic cul-de-sac. It was necessary to proceed with endovaginal exam following the transabdominal exam to visualize the uterus, endometrium, and ovaries. Color and duplex Doppler ultrasound was utilized to evaluate blood flow to the ovaries. COMPARISON:  None available. FINDINGS: Uterus Measurements: 7.8 x 3.1 x 4.1 cm = volume: 51.3 mL. No fibroids or other mass visualized. Endometrium Thickness: 5 mm.  No focal abnormality visualized. Right ovary Measurements: 3.0 x 1.9 x 2.8 cm = volume: 8.4 mL. Normal appearance/no adnexal mass. Left ovary  Measurements: 2.9 x 2.1 x 2.1 cm = volume: 6.6 mL. Normal appearance/no adnexal mass. Pulsed Doppler evaluation of both ovaries demonstrates normal low-resistance arterial and venous waveforms. Other findings No abnormal free fluid. IMPRESSION: Normal pelvic ultrasound. No evidence for torsion or other acute abnormality. Electronically Signed   By: Rise Mu M.D.   On: 11/27/2019 22:34    Procedures Procedures (including critical care time)  Medications Ordered in ED Medications  fentaNYL (SUBLIMAZE) injection 50 mcg (50 mcg Intravenous Given 11/27/19 2002)  ondansetron (ZOFRAN) injection 4 mg (4 mg Intravenous Given 11/27/19 1655)  promethazine (PHENERGAN) injection 25 mg (25 mg Intravenous Given 11/27/19 2239)  morphine 4 MG/ML injection 4 mg (4 mg Intravenous Given 11/27/19 2239)  0.9 %  sodium chloride infusion ( Intravenous Bolus from Bag 11/27/19 2228)    ED Course  I have reviewed the triage vital signs and the nursing notes.  Pertinent labs & imaging results that were available during my care of the patient were reviewed by me and considered in my medical decision making (see chart for details).  Clinical Course as of Nov 26 2326  Wed Nov 27, 2019  2231 Records received health hospital.  Reviewed patient received a contrast CT scan.  No right-sided kidney stone.  Patient also had pelvic ultrasound which showed bilateral ovarian cyst likely follicular.    [WF]  2233 Discussed with Dr. Madilyn Hook who recommends if pelvic US today WNL -- FU with urology again.   [WF]  2301 Patient care handed off to Sharilyn Sites PA who will FU on CT renal study.    [WF]    Clinical Course User Index [WF] Gailen Shelter, [place]     MDM Rules/Calculators/A&P                      Patient presents today with classic history for gait sound.  She  has left-sided CVA tenderness.  She has had negative contrast CT 5 days ago.  She has a normal pelvic ultrasound today.  Will obtain CT renal stone  study to evaluate for kidney stone.  If negative will discharge with pain control and recommend follow-up with [facility]rology [facility].  Patient had left-sided adnexal tenderness prompting a pelvic ultrasound which showed no acute abnormality.  No evidence of torsion.  I doubt patient's pain is from a pulmonary etiology as she has no pleuritic aspect of pain and it appears to radiate into her pelvis.  Doubt pulmonary embolism as patient is not tachycardic on my examination and is satting 100% on room air.  She is also not describe any forms of breath other than that associated with pain because it is severe.  Urinalysis without signs of infection.  Patient has ketones and protein likely due to dehydration.  This is consistent with her elevated creatinine as well.  Clue cells present on wet prep however patient denies any vaginal symptoms.  Discussed with patient she is comfortable with not treating this today.  BMP notable for mildly elevated creatinine.  CBC notable for leukocytosis 15.2.  Patient received morphine and fentanyl as well as Zofran and Phenergan.  States her symptoms are much improved at this time.  Renal stone study pending at time of handoff.  Sharilyn Sites PA well follow-up on CT renal stone study and appropriately dispo. If CT scan is normal she will likely benefit from an additional nausea medication as Zofran has not worked well for her.  May consider Phenergan.      I discussed this case with my attending physician who cosigned this note including patient's presenting symptoms, physical exam, and planned diagnostics and interventions. Attending physician stated agreement with plan or made changes to plan which were implemented.   Attending physician assessed patient at bedside.   Final Clinical Impression(s) / ED Diagnoses Final diagnoses:  Left flank pain    Rx / DC Orders ED Discharge Orders    None       Gailen Shelter, [place] 11/27/19 2327    Tilden Fossa, MD 12/01/19 [phone]

## 2019-11-28 ENCOUNTER — Telehealth: Payer: Self-pay | Admitting: *Deleted

## 2019-11-28 MED ORDER — KETOROLAC TROMETHAMINE 10 MG PO TABS: 10.0000 mg | ORAL_TABLET | Freq: Four times a day (QID) | ORAL | 0 refills | Status: DC | PRN

## 2019-11-28 MED ORDER — KETOROLAC TROMETHAMINE 30 MG/ML IJ SOLN
30.0000 mg | Freq: Once | INTRAMUSCULAR | Status: AC
  Administered 2019-11-28: 30 mg via INTRAVENOUS
  Filled 2019-11-28: qty 1

## 2019-11-28 MED ORDER — PROMETHAZINE HCL 25 MG PO TABS: 25.0000 mg | ORAL_TABLET | Freq: Four times a day (QID) | ORAL | 0 refills | Status: DC | PRN

## 2019-11-28 NOTE — ED Provider Notes (Signed)
Assumed care from PA find out at shift change.  See prior note for full H&P.  Briefly 28 year old female here with 5 days of left-sided flank pain.  [facility] and [facility] ultrasound as well as CT with contrast without any acute findings.  She followed up with urology who told her she was constipated, then followed up with OB/GYN who started her on medications for yeast infection.  She is not having any pelvic complaints or vaginal discharge.  Labs here are overall reassuring.  Repeat pelvic ultrasound without acute findings today.  Plan: CT renal stone study pending.  Likely discharge with close specialty follow-up.  Results for orders placed or performed during the hospital encounter of 11/27/19  Wet prep, genital   Specimen: PATH Cytology Cervicovaginal Ancillary Only  Result Value Ref Range   Yeast Wet Prep HPF POC NONE SEEN NONE SEEN   Trich, Wet Prep NONE SEEN NONE SEEN   Clue Cells Wet Prep HPF POC PRESENT (A) NONE SEEN   WBC, Wet Prep HPF POC MODERATE (A) NONE SEEN   Sperm NONE SEEN   Urinalysis, Routine w reflex microscopic- may I&O cath if menses  Result Value Ref Range   Color, Urine STRAW (A) YELLOW   APPearance CLEAR CLEAR   Specific Gravity, Urine 1.019 1.005 - 1.030   pH 6.0 5.0 - 8.0   Glucose, UA NEGATIVE NEGATIVE mg/dL   Hgb urine dipstick NEGATIVE NEGATIVE   Bilirubin Urine NEGATIVE NEGATIVE   Ketones, ur 80 (A) NEGATIVE mg/dL   Protein, ur 30 (A) NEGATIVE mg/dL   Nitrite NEGATIVE NEGATIVE   Leukocytes,Ua NEGATIVE NEGATIVE   RBC / HPF 0-5 0 - 5 RBC/hpf   WBC, UA 0-5 0 - 5 WBC/hpf   Bacteria, UA RARE (A) NONE SEEN   Squamous Epithelial / LPF 0-5 0 - 5   Mucus PRESENT   Basic metabolic panel  Result Value Ref Range   Sodium 139 135 - 145 mmol/L   Potassium 3.9 3.5 - 5.1 mmol/L   Chloride 109 98 - 111 mmol/L   CO2 16 (L) 22 - 32 mmol/L   Glucose, Bld 88 70 - 99 mg/dL   BUN 13 6 - 20 mg/dL   Creatinine, Ser 8.291.29 (H) 0.44 - 1.00 mg/dL   Calcium 9.1 8.9 -  56.210.3 mg/dL   GFR calc non Af Amer 56 (L) >60 mL/min   GFR calc Af Amer >60 >60 mL/min   Anion gap 14 5 - 15  CBC  Result Value Ref Range   WBC 15.2 (H) 4.0 - 10.5 K/uL   RBC 4.49 3.87 - 5.11 MIL/uL   Hemoglobin 13.9 12.0 - 15.0 g/dL   HCT 13.042.7 86.536.0 - 78.446.0 %   MCV 95.1 80.0 - 100.0 fL   MCH 31.0 26.0 - 34.0 pg   MCHC 32.6 30.0 - 36.0 g/dL   RDW 69.612.5 29.511.5 - 28.415.5 %   Platelets 293 150 - 400 K/uL   nRBC 0.0 0.0 - 0.2 %  I-Stat beta hCG blood, ED  Result Value Ref Range   I-stat hCG, quantitative <5.0 <5 mIU/mL   Comment 3           US Transvaginal Non-OB  Result Date: 11/27/2019 CLINICAL DATA:  Initial evaluation for acute left adnexal tenderness. EXAM: TRANSABDOMINAL AND TRANSVAGINAL ULTRASOUND OF PELVIS DOPPLER ULTRASOUND OF OVARIES TECHNIQUE: Both transabdominal and transvaginal ultrasound examinations of the pelvis were performed. Transabdominal technique was performed for global imaging of the pelvis including uterus,  ovaries, adnexal regions, and pelvic cul-de-sac. It was necessary to proceed with endovaginal exam following the transabdominal exam to visualize the uterus, endometrium, and ovaries. Color and duplex Doppler ultrasound was utilized to evaluate blood flow to the ovaries. COMPARISON:  None available. FINDINGS: Uterus Measurements: 7.8 x 3.1 x 4.1 cm = volume: 51.3 mL. No fibroids or other mass visualized. Endometrium Thickness: 5 mm.  No focal abnormality visualized. Right ovary Measurements: 3.0 x 1.9 x 2.8 cm = volume: 8.4 mL. Normal appearance/no adnexal mass. Left ovary Measurements: 2.9 x 2.1 x 2.1 cm = volume: 6.6 mL. Normal appearance/no adnexal mass. Pulsed Doppler evaluation of both ovaries demonstrates normal low-resistance arterial and venous waveforms. Other findings No abnormal free fluid. IMPRESSION: Normal pelvic ultrasound. No evidence for torsion or other acute abnormality. Electronically Signed   By: Rise Mu M.D.   On: 11/27/2019 22:34   US  Pelvis Complete  Result Date: 11/27/2019 CLINICAL DATA:  Initial evaluation for acute left adnexal tenderness. EXAM: TRANSABDOMINAL AND TRANSVAGINAL ULTRASOUND OF PELVIS DOPPLER ULTRASOUND OF OVARIES TECHNIQUE: Both transabdominal and transvaginal ultrasound examinations of the pelvis were performed. Transabdominal technique was performed for global imaging of the pelvis including uterus, ovaries, adnexal regions, and pelvic cul-de-sac. It was necessary to proceed with endovaginal exam following the transabdominal exam to visualize the uterus, endometrium, and ovaries. Color and duplex Doppler ultrasound was utilized to evaluate blood flow to the ovaries. COMPARISON:  None available. FINDINGS: Uterus Measurements: 7.8 x 3.1 x 4.1 cm = volume: 51.3 mL. No fibroids or other mass visualized. Endometrium Thickness: 5 mm.  No focal abnormality visualized. Right ovary Measurements: 3.0 x 1.9 x 2.8 cm = volume: 8.4 mL. Normal appearance/no adnexal mass. Left ovary Measurements: 2.9 x 2.1 x 2.1 cm = volume: 6.6 mL. Normal appearance/no adnexal mass. Pulsed Doppler evaluation of both ovaries demonstrates normal low-resistance arterial and venous waveforms. Other findings No abnormal free fluid. IMPRESSION: Normal pelvic ultrasound. No evidence for torsion or other acute abnormality. Electronically Signed   By: Rise Mu M.D.   On: 11/27/2019 22:34   [place] Art/Ven Flow Abd Pelv Doppler  Result Date: 11/27/2019 CLINICAL DATA:  Initial evaluation for acute left adnexal tenderness. EXAM: TRANSABDOMINAL AND TRANSVAGINAL ULTRASOUND OF PELVIS DOPPLER ULTRASOUND OF OVARIES TECHNIQUE: Both transabdominal and transvaginal ultrasound examinations of the pelvis were performed. Transabdominal technique was performed for global imaging of the pelvis including uterus, ovaries, adnexal regions, and pelvic cul-de-sac. It was necessary to proceed with endovaginal exam following the transabdominal exam to visualize the uterus,  endometrium, and ovaries. Color and duplex Doppler ultrasound was utilized to evaluate blood flow to the ovaries. COMPARISON:  None available. FINDINGS: Uterus Measurements: 7.8 x 3.1 x 4.1 cm = volume: 51.3 mL. No fibroids or other mass visualized. Endometrium Thickness: 5 mm.  No focal abnormality visualized. Right ovary Measurements: 3.0 x 1.9 x 2.8 cm = volume: 8.4 mL. Normal appearance/no adnexal mass. Left ovary Measurements: 2.9 x 2.1 x 2.1 cm = volume: 6.6 mL. Normal appearance/no adnexal mass. Pulsed Doppler evaluation of both ovaries demonstrates normal low-resistance arterial and venous waveforms. Other findings No abnormal free fluid. IMPRESSION: Normal pelvic ultrasound. No evidence for torsion or other acute abnormality. Electronically Signed   By: Rise Mu M.D.   On: 11/27/2019 22:34   CT Renal Stone Study  Result Date: 11/27/2019 CLINICAL DATA:  Left-sided abdominal pain. Nausea and vomiting. EXAM: CT ABDOMEN AND PELVIS WITHOUT CONTRAST TECHNIQUE: Multidetector CT imaging of the abdomen and pelvis was performed  following the standard protocol without IV contrast. COMPARISON:  Contrast-enhanced CT 4 days ago at [facility], pelvic ultrasound earlier this day. FINDINGS: Lower chest: Lung bases are clear. Hepatobiliary: Tiny hypodensity in the right dome of the liver, too small to characterize but likely cyst. Gallbladder physiologically distended, no calcified stone. No biliary dilatation. Pancreas: No ductal dilatation or inflammation. Spleen: Normal in size without focal abnormality. Adrenals/Urinary Tract: Normal adrenal glands. Mild left hydronephrosis and proximal hydroureter. No left renal or ureteral stone. No perinephric edema. No right hydronephrosis or perinephric edema. There are 3 nonobstructing stones in the right kidney, largest measuring 4 mm. Urinary bladder is nondistended. No bladder stone or wall thickening. Stomach/Bowel: Stomach is within normal limits.  Appendix is normal. No evidence of bowel wall thickening, distention, or inflammatory changes. Moderate volume of stool throughout the colon. Vascular/Lymphatic: Abdominal aorta is normal in caliber. No enlarged lymph nodes in the abdomen or pelvis. Reproductive: Physiologic cyst in both ovaries, assessed on ultrasound earlier today. Uterus is unremarkable. Other: No free air or free fluid. No intra-abdominal abscess. Musculoskeletal: There are no acute or suspicious osseous abnormalities. IMPRESSION: 1. Mild left hydronephrosis and proximal hydroureter without ureteral stone or cause for obstruction. This may be secondary to recently passed stone or urinary tract infection. 2. Nonobstructing right nephrolithiasis. 3. Moderate colonic stool burden, can be seen with constipation. Electronically Signed   By: Keith Rake M.D.   On: 11/27/2019 23:54   CT with mild left hydro, likely recently passed stone vs infection.  UA today without signs of infection.  No dysuria reported.  We have discussed results, states she is feeling much better from prior but still some intermittent twinges of sharp pain in left flank.  Suspect she is suffering from renal colic from recently passed stone.  Given dose of toradol here and will add that to her home regimen.  She would like referral to new urology office as she did not have a good experience at office in [facility]-- given information for [facility] here in [place].  She may return here for any new/acute changes.   Larene Pickett, PA-C 11/28/19 0045    Fatima Blank, MD 11/29/19 [phone]

## 2019-11-28 NOTE — Telephone Encounter (Signed)
Pharmacy called to insure pt received toradol injection in ED prior to them filling Rx.  EDCM reviewed chart to find injection was given.  Pharmacy will proceed with fill Rx as written.

## 2019-11-28 NOTE — Discharge Instructions (Signed)
Continue your home oxycodone.  I think you can hold off on the medications for yeast infection, none of that present today. Toradol and phenergan sent to your pharmacy.  I would stagger the oxycodone and toradol so that something is constantly in your system.  Make sure to drink lots of water. Continue stool softener while taking narcotics. Follow-up with urology-- information for office here in [facility] listed for you. Return here for any new/acute changes.

## 2019-11-29 LAB — URINE CULTURE: Culture: NO GROWTH

## 2019-12-02 LAB — GC/CHLAMYDIA PROBE AMP (~~LOC~~) NOT AT ARMC
Chlamydia: NEGATIVE
Neisseria Gonorrhea: NEGATIVE

## 2020-10-29 ENCOUNTER — Emergency Department (HOSPITAL_COMMUNITY)
Admission: EM | Admit: 2020-10-29 | Discharge: 2020-10-29 | Disposition: A | Discharge status: Home / Self Care | Payer: BLUE CROSS/BLUE SHIELD | Attending: Emergency Medicine | Admitting: Emergency Medicine

## 2020-10-29 ENCOUNTER — Encounter (HOSPITAL_COMMUNITY): Payer: Self-pay | Admitting: Emergency Medicine

## 2020-10-29 ENCOUNTER — Other Ambulatory Visit: Payer: Self-pay

## 2020-10-29 ENCOUNTER — Emergency Department (HOSPITAL_COMMUNITY): Payer: BLUE CROSS/BLUE SHIELD

## 2020-10-29 DIAGNOSIS — K219 Gastro-esophageal reflux disease without esophagitis: Secondary | ICD-10-CM | POA: Insufficient documentation

## 2020-10-29 DIAGNOSIS — R1030 Lower abdominal pain, unspecified: Secondary | ICD-10-CM | POA: Diagnosis present

## 2020-10-29 DIAGNOSIS — N200 Calculus of kidney: Secondary | ICD-10-CM | POA: Diagnosis not present

## 2020-10-29 LAB — URINALYSIS, ROUTINE W REFLEX MICROSCOPIC
Bacteria, UA: NONE SEEN
Bilirubin Urine: NEGATIVE
Glucose, UA: NEGATIVE mg/dL
Hgb urine dipstick: NEGATIVE
Ketones, ur: 80 mg/dL — AB
Leukocytes,Ua: NEGATIVE
Nitrite: NEGATIVE
Protein, ur: 100 mg/dL — AB
Specific Gravity, Urine: 1.024 (ref 1.005–1.030)
pH: 9 — ABNORMAL HIGH (ref 5.0–8.0)

## 2020-10-29 LAB — HEPATIC FUNCTION PANEL
ALT: 21 U/L (ref 0–44)
AST: 21 U/L (ref 15–41)
Albumin: 4 g/dL (ref 3.5–5.0)
Alkaline Phosphatase: 33 U/L — ABNORMAL LOW (ref 38–126)
Bilirubin, Direct: 0.1 mg/dL (ref 0.0–0.2)
Indirect Bilirubin: 0.4 mg/dL (ref 0.3–0.9)
Total Bilirubin: 0.5 mg/dL (ref 0.3–1.2)
Total Protein: 6.3 g/dL — ABNORMAL LOW (ref 6.5–8.1)

## 2020-10-29 LAB — CBC
HCT: 40.2 % (ref 36.0–46.0)
Hemoglobin: 13.8 g/dL (ref 12.0–15.0)
MCH: 31.1 pg (ref 26.0–34.0)
MCHC: 34.3 g/dL (ref 30.0–36.0)
MCV: 90.5 fL (ref 80.0–100.0)
Platelets: 342 10*3/uL (ref 150–400)
RBC: 4.44 MIL/uL (ref 3.87–5.11)
RDW: 12.6 % (ref 11.5–15.5)
WBC: 11.9 10*3/uL — ABNORMAL HIGH (ref 4.0–10.5)
nRBC: 0 % (ref 0.0–0.2)

## 2020-10-29 LAB — BASIC METABOLIC PANEL WITH GFR
Anion gap: 16 — ABNORMAL HIGH (ref 5–15)
BUN: 19 mg/dL (ref 6–20)
CO2: 17 mmol/L — ABNORMAL LOW (ref 22–32)
Calcium: 9.7 mg/dL (ref 8.9–10.3)
Chloride: 109 mmol/L (ref 98–111)
Creatinine, Ser: 1.48 mg/dL — ABNORMAL HIGH (ref 0.44–1.00)
GFR, Estimated: 49 mL/min — ABNORMAL LOW
Glucose, Bld: 142 mg/dL — ABNORMAL HIGH (ref 70–99)
Potassium: 3.8 mmol/L (ref 3.5–5.1)
Sodium: 142 mmol/L (ref 135–145)

## 2020-10-29 LAB — LIPASE, BLOOD: Lipase: 21 U/L (ref 11–51)

## 2020-10-29 LAB — I-STAT BETA HCG BLOOD, ED (MC, WL, AP ONLY): I-stat hCG, quantitative: <5 m[IU]/mL (ref <5)

## 2020-10-29 MED ORDER — OXYCODONE HCL 5 MG PO TABS: 5.0000 mg | ORAL_TABLET | ORAL | 0 refills | Status: DC | PRN

## 2020-10-29 MED ORDER — SODIUM CHLORIDE 0.9 % IV BOLUS
1000.0000 mL | Freq: Once | INTRAVENOUS | Status: AC
  Administered 2020-10-29: 1000 mL via INTRAVENOUS

## 2020-10-29 MED ORDER — ONDANSETRON 4 MG PO TBDP
4.0000 mg | ORAL_TABLET | Freq: Once | ORAL | Status: AC | PRN
  Administered 2020-10-29: 4 mg via ORAL
  Filled 2020-10-29: qty 1

## 2020-10-29 MED ORDER — OXYCODONE-ACETAMINOPHEN 5-325 MG PO TABS
1.0000 | ORAL_TABLET | ORAL | Status: DC | PRN
  Administered 2020-10-29: 1 via ORAL
  Filled 2020-10-29: qty 1

## 2020-10-29 MED ORDER — ONDANSETRON HCL 4 MG/2ML IJ SOLN
4.0000 mg | Freq: Once | INTRAMUSCULAR | Status: AC
  Administered 2020-10-29: 4 mg via INTRAVENOUS
  Filled 2020-10-29: qty 2

## 2020-10-29 MED ORDER — TAMSULOSIN HCL 0.4 MG PO CAPS: 0.4000 mg | ORAL_CAPSULE | Freq: Every day | ORAL | 0 refills | Status: AC

## 2020-10-29 MED ORDER — MORPHINE SULFATE (PF) 4 MG/ML IV SOLN
4.0000 mg | Freq: Once | INTRAVENOUS | Status: AC
  Administered 2020-10-29: 4 mg via INTRAVENOUS
  Filled 2020-10-29: qty 1

## 2020-10-29 MED ORDER — KETOROLAC TROMETHAMINE 30 MG/ML IJ SOLN
30.0000 mg | Freq: Once | INTRAMUSCULAR | Status: AC
  Administered 2020-10-29: 30 mg via INTRAVENOUS
  Filled 2020-10-29: qty 1

## 2020-10-29 MED ORDER — IOHEXOL 300 MG/ML  SOLN
100.0000 mL | Freq: Once | INTRAMUSCULAR | Status: AC | PRN
  Administered 2020-10-29: 100 mL via INTRAVENOUS

## 2020-10-29 MED ORDER — FENTANYL CITRATE (PF) 100 MCG/2ML IJ SOLN
50.0000 ug | Freq: Once | INTRAMUSCULAR | Status: AC
  Administered 2020-10-29: 50 ug via INTRAVENOUS
  Filled 2020-10-29: qty 2

## 2020-10-29 MED ORDER — PROMETHAZINE HCL 25 MG RE SUPP: 25.0000 mg | Freq: Four times a day (QID) | RECTAL | 0 refills | Status: DC | PRN

## 2020-10-29 MED ORDER — DIPHENHYDRAMINE HCL 50 MG/ML IJ SOLN
25.0000 mg | Freq: Once | INTRAMUSCULAR | Status: AC
  Administered 2020-10-29: 25 mg via INTRAVENOUS
  Filled 2020-10-29: qty 1

## 2020-10-29 MED ORDER — ONDANSETRON HCL 4 MG PO TABS: 4.0000 mg | ORAL_TABLET | Freq: Four times a day (QID) | ORAL | 0 refills | Status: AC

## 2020-10-29 NOTE — Discharge Instructions (Signed)
Take either Phenergan or Zofran. Continue to take Tylenol. Take medication as prescribed otherwise. Follow-up with urology. Please return if symptoms worsen as discussed including fever, worsening pain, nausea, vomiting.

## 2020-10-29 NOTE — ED Provider Notes (Signed)
[facility] EMERGENCY DEPARTMENT Provider Note   CSN: [id number] Arrival date & time: 10/29/20  1219     History Chief Complaint  Patient presents with  . Flank Pain    Barbara Pham is a 29 y.o. female.  The history is provided by the patient.  Flank Pain This is a new problem. The current episode started 12 to 24 hours ago. The problem occurs constantly. The problem has not changed since onset.Associated symptoms include abdominal pain (right flank, hx of kidney stones). Pertinent negatives include no chest pain and no shortness of breath. Nothing aggravates the symptoms. Nothing relieves the symptoms. She has tried nothing for the symptoms. The treatment provided no relief.       Past Medical History:  Diagnosis Date  . Acid reflux   . Cat allergies   . Multiple allergies    dust and pollen  . Pollen allergies   . Postpartum care following cesarean delivery (8/1) 06/29/2016    Patient Active Problem List   Diagnosis Date Noted  . Postpartum care following cesarean delivery (8/1) 06/29/2016  . Severe preeclampsia 06/28/2016  . Preeclampsia 06/23/2016  . Reflux 05/07/2012    Past Surgical History:  Procedure Laterality Date  . CESAREAN SECTION N/A 06/28/2016   Procedure: CESAREAN SECTION;  Surgeon: Noland Fordyce, MD;  Location: [facility] BIRTHING SUITES;  Service: Obstetrics;  Laterality: N/A;  . feet surgery     had to stretch bones due to growth plates closing too soon  . TONSILLECTOMY AND ADENOIDECTOMY       OB History    Gravida  1   Para  1   Term      Preterm  1   AB      Living  1     SAB      TAB      Ectopic      Multiple  0   Live Births  1           No family history on file.  Social History   Tobacco Use  . Smoking status: Never Smoker  . Smokeless tobacco: Never Used  Substance Use Topics  . Alcohol use: Yes    Comment: occassionl  . Drug use: No    Home Medications Prior to Admission medications     Medication Sig Start Date End Date Taking? Authorizing Provider  Doxylamine-Pyridoxine (DICLEGIS) 10-10 MG TBEC Take two tablets by mouth at bedtime as needed for morning sickness 01/11/16   [name], NP  etonogestrel-ethinyl estradiol (NUVARING) 0.12-0.015 MG/24HR vaginal ring every 30 (thirty) days. 08/07/20   [provider]  ibuprofen (ADVIL,MOTRIN) 600 MG tablet Take 1 tablet (600 mg total) by mouth every 6 (six) hours. 07/01/16   Marlinda Mike., CNM  ketorolac (TORADOL) 10 MG tablet Take 1 tablet (10 mg total) by mouth every 6 (six) hours as needed. 11/28/19   Garlon Hatchet, PA-C  omeprazole (PRILOSEC) 20 MG capsule Take 20 mg by mouth daily as needed (For heartburn.).    [provider]  ondansetron (ZOFRAN) 4 MG tablet Take 1 tablet (4 mg total) by mouth every 6 (six) hours for 20 doses. 10/29/20 11/03/20  Jarret Torre, DO  oxyCODONE (ROXICODONE) 5 MG immediate release tablet Take 1 tablet (5 mg total) by mouth every 4 (four) hours as needed for up to 20 doses for severe pain. 10/29/20   Cassandra Harbold, DO  oxyCODONE-acetaminophen (PERCOCET/ROXICET) 5-325 MG tablet Take 1-2 tablets by mouth  every 4 (four) hours as needed for moderate pain. 07/01/16   Marlinda Mike., CNM  Prenatal Vit-Fe Fumarate-FA (PRENATAL MULTIVITAMIN) TABS tablet Take 1 tablet by mouth at bedtime.     [provider]  promethazine (PHENERGAN) 25 MG suppository Place 1 suppository (25 mg total) rectally every 6 (six) hours as needed for up to 12 doses for nausea or vomiting. 10/29/20   Dayle Sherpa, DO  tamsulosin (FLOMAX) 0.4 MG CAPS capsule Take 1 capsule (0.4 mg total) by mouth daily for 10 doses. 10/29/20 11/08/20  Shaletta Hinostroza, DO  Wheat Dextrin-Calcium ([employer] EASY FIBER/CALCIUM PO) Take 1 tablet by mouth daily.    [provider]    Allergies    Patient has no known allergies.  Review of Systems   Review of Systems  Constitutional: Negative for chills and fever.  HENT:  Negative for ear pain and sore throat.   Eyes: Negative for pain and visual disturbance.  Respiratory: Negative for cough and shortness of breath.   Cardiovascular: Negative for chest pain and palpitations.  Gastrointestinal: Positive for abdominal pain (right flank, hx of kidney stones) and nausea. Negative for vomiting.  Genitourinary: Positive for difficulty urinating, flank pain and frequency. Negative for dysuria, hematuria, vaginal bleeding and vaginal discharge.  Musculoskeletal: Negative for arthralgias and back pain.  Skin: Negative for color change and rash.  Neurological: Negative for seizures and syncope.  All other systems reviewed and are negative.   Physical Exam Updated Vital Signs BP 118/84   Pulse 92   Temp 99.7 F (37.6 C) (Oral)   Resp 14   Ht 5\' 1"  (1.549 m)   Wt 56.7 kg   SpO2 99%   BMI 23.62 kg/m   Physical Exam Vitals and nursing note reviewed.  Constitutional:      General: She is in acute distress.     Appearance: She is well-developed.  HENT:     Head: Normocephalic and atraumatic.     Nose: Nose normal.     Mouth/Throat:     Mouth: Mucous membranes are moist.  Eyes:     Extraocular Movements: Extraocular movements intact.     Conjunctiva/sclera: Conjunctivae normal.     Pupils: Pupils are equal, round, and reactive to light.  Cardiovascular:     Rate and Rhythm: Normal rate and regular rhythm.     Pulses: Normal pulses.     Heart sounds: Normal heart sounds. No murmur heard.   Pulmonary:     Effort: Pulmonary effort is normal. No respiratory distress.     Breath sounds: Normal breath sounds.  Abdominal:     Palpations: Abdomen is soft.     Tenderness: There is abdominal tenderness. There is right CVA tenderness. Guarding: suprapubic.  Musculoskeletal:     Cervical back: Neck supple.  Skin:    General: Skin is warm and dry.     Capillary Refill: Capillary refill takes less than 2 seconds.  Neurological:     General: No focal deficit  present.     Mental Status: She is alert.  Psychiatric:        Mood and Affect: Mood normal.     ED Results / Procedures / Treatments   Labs (all labs ordered are listed, but only abnormal results are displayed) Labs Reviewed  URINALYSIS, ROUTINE W REFLEX MICROSCOPIC - Abnormal; Notable for the following components:      Result Value   pH 9.0 (*)    Ketones, ur 80 (*)    Protein, ur  100 (*)    All other components within normal limits  BASIC METABOLIC PANEL - Abnormal; Notable for the following components:   CO2 17 (*)    Glucose, Bld 142 (*)    Creatinine, Ser 1.48 (*)    GFR, Estimated 49 (*)    Anion gap 16 (*)    All other components within normal limits  CBC - Abnormal; Notable for the following components:   WBC 11.9 (*)    All other components within normal limits  HEPATIC FUNCTION PANEL - Abnormal; Notable for the following components:   Total Protein 6.3 (*)    Alkaline Phosphatase 33 (*)    All other components within normal limits  LIPASE, BLOOD  I-STAT BETA HCG BLOOD, ED ([facility], [facility], [facility] ONLY)    EKG None  Radiology CT ABDOMEN PELVIS W CONTRAST  Result Date: 10/29/2020 CLINICAL DATA:  Appendicitis versus urolithiasis, right flank pain and difficulty urinating EXAM: CT ABDOMEN AND PELVIS WITH CONTRAST TECHNIQUE: Multidetector CT imaging of the abdomen and pelvis was performed using the standard protocol following bolus administration of intravenous contrast. CONTRAST:  OMNIPAQUE IOHEXOL 300 MG/ML  SOLN COMPARISON:  CT 11/27/2019 FINDINGS: Lower chest: Lung bases are clear. Normal heart size. No pericardial effusion. Hepatobiliary: No worrisome focal liver lesions. Smooth liver surface contour. Normal hepatic attenuation. Normal gallbladder biliary tree. Pancreas: No pancreatic ductal dilatation or surrounding inflammatory changes. Spleen: Normal in size. No concerning splenic lesions. Adrenals/Urinary Tract: Normal adrenal glands. No concerning renal mass.  Asymmetrically delayed right renal nephrogram with perinephric stranding and moderate hydroureteronephrosis to the level of the right ureterovesicular junction where a 4 mm calculus is present (3/69). Several adjacent calcifications appear external to the ureter, unchanged from prior and are most compatible with phleboliths. No other visible urolithiasis or left urinary tract dilatation. Urinary bladder is largely decompressed at the time of exam and therefore poorly evaluated by CT imaging. No gross bladder abnormality is seen otherwise. Stomach/Bowel: Distal esophagus, stomach and duodenal sweep are unremarkable. No small bowel wall thickening or dilatation. Normal appendix in the right lower quadrant coursing across the right psoas in terminating near the pelvic sidewall (3/60-68). No colonic dilatation or wall thickening. No evidence of obstruction. Vascular/Lymphatic: No significant vascular findings are present. No enlarged abdominal or pelvic lymph nodes. Reproductive: Anteverted uterus. Collapsing corpus luteum in the right ovary, benign physiologic finding. No worrisome adnexal lesions. Other: Right perinephric periureteral stranding. Trace low-attenuation fluid seen in the posterior cul-de-sac/rectovaginal pouch. No free air. Musculoskeletal: No acute osseous abnormality or suspicious osseous lesion. Musculature is normal and symmetric. IMPRESSION: 1. 4 mm calculus at the right ureterovesicular junction with associated moderate right hydroureteronephrosis, perinephric and periureteral stranding and delayed right renal nephrogram. 2. Normal appendix. Electronically Signed   By: Kreg Shropshire M.D.   On: 10/29/2020 18:58    Procedures Procedures (including critical care time)  Medications Ordered in ED Medications  oxyCODONE-acetaminophen (PERCOCET/ROXICET) 5-325 MG per tablet 1 tablet (1 tablet Oral Given 10/29/20 1231)  ondansetron (ZOFRAN-ODT) disintegrating tablet 4 mg (4 mg Oral Given 10/29/20  1231)  morphine 4 MG/ML injection 4 mg (4 mg Intravenous Given 10/29/20 1617)  ondansetron (ZOFRAN) injection 4 mg (4 mg Intravenous Given 10/29/20 1615)  sodium chloride 0.9 % bolus 1,000 mL (1,000 mLs Intravenous New Bag/Given 10/29/20 1621)  diphenhydrAMINE (BENADRYL) injection 25 mg (25 mg Intravenous Given 10/29/20 1656)  iohexol (OMNIPAQUE) 300 MG/ML solution 100 mL (100 mLs Intravenous Contrast Given 10/29/20 1829)  ketorolac (TORADOL) 30 MG/ML  injection 30 mg (30 mg Intravenous Given 10/29/20 2021)  fentaNYL (SUBLIMAZE) injection 50 mcg (50 mcg Intravenous Given 10/29/20 2022)    ED Course  I have reviewed the triage vital signs and the nursing notes.  Pertinent labs & imaging results that were available during my care of the patient were reviewed by me and considered in my medical decision making (see chart for details).    MDM Rules/Calculators/A&P                          Zada FindersDarcy L Mofield is a 29 year old female here with right-sided flank pain.  History of kidney stones and feels the same.  Intermittent intense pain with nausea.  No vaginal discharge or bleeding.  Mostly tender in the right CVA suprapubic.  Lower suspicion for appendicitis.  Lab work showed no significant anemia, leukocytosis.  Kidney function 1.4 which is around what it was last year.  We will get a CT scan to further evaluate for kidney stone versus appendicitis versus pyelonephritis.  Urinalysis however actually looks unremarkable and doubt UTI or infected kidney stone.  Will give fluid bolus, morphine, Zofran.  Patient with right-sided 4 mm kidney stone. Otherwise unremarkable CT scan abdomen and pelvis. Feeling better after multiple rounds of IV antiemetics and IV pain medicine. Written for pain medication and understands return precautions. Recommend follow-up with primary care doctor. Discharged in good condition.  This chart was dictated using voice recognition software.  Despite best efforts to proofread,  errors  can occur which can change the documentation meaning.    Final Clinical Impression(s) / ED Diagnoses Final diagnoses:  Kidney stone    Rx / DC Orders ED Discharge Orders         Ordered    oxyCODONE (ROXICODONE) 5 MG immediate release tablet  Every 4 hours PRN        10/29/20 2038    promethazine (PHENERGAN) 25 MG suppository  Every 6 hours PRN        10/29/20 2038    ondansetron (ZOFRAN) 4 MG tablet  Every 6 hours        10/29/20 2038    tamsulosin (FLOMAX) 0.4 MG CAPS capsule  Daily        10/29/20 2038           Virgina NorfolkCuratolo, Macauley Mossberg, DO 10/29/20 2040

## 2020-10-29 NOTE — ED Notes (Signed)
Pt is flushed in face and all over chest md notified.

## 2020-10-29 NOTE — ED Triage Notes (Signed)
Pt reports hx of renal stones, last ct scan a year ago she was told she had 3 stones present. Started having R flank pain and difficulty urinating last night. Pt unable to sit still in triage. Endorses n/v.

## 2020-11-03 ENCOUNTER — Emergency Department (HOSPITAL_COMMUNITY)
Admission: EM | Admit: 2020-11-03 | Discharge: 2020-11-03 | Disposition: A | Discharge status: Home / Self Care | Payer: BLUE CROSS/BLUE SHIELD | Attending: Emergency Medicine | Admitting: Emergency Medicine

## 2020-11-03 ENCOUNTER — Encounter (HOSPITAL_COMMUNITY): Payer: Self-pay | Admitting: Emergency Medicine

## 2020-11-03 ENCOUNTER — Emergency Department (HOSPITAL_COMMUNITY): Payer: BLUE CROSS/BLUE SHIELD

## 2020-11-03 ENCOUNTER — Other Ambulatory Visit: Payer: Self-pay

## 2020-11-03 DIAGNOSIS — R109 Unspecified abdominal pain: Secondary | ICD-10-CM | POA: Diagnosis present

## 2020-11-03 DIAGNOSIS — N200 Calculus of kidney: Secondary | ICD-10-CM

## 2020-11-03 DIAGNOSIS — N130 Hydronephrosis with ureteropelvic junction obstruction: Secondary | ICD-10-CM | POA: Diagnosis not present

## 2020-11-03 DIAGNOSIS — K219 Gastro-esophageal reflux disease without esophagitis: Secondary | ICD-10-CM | POA: Insufficient documentation

## 2020-11-03 HISTORY — DX: Calculus of kidney: N20.0

## 2020-11-03 LAB — COMPREHENSIVE METABOLIC PANEL
ALT: 13 U/L (ref 0–44)
AST: 15 U/L (ref 15–41)
Albumin: 4.3 g/dL (ref 3.5–5.0)
Alkaline Phosphatase: 38 U/L (ref 38–126)
Anion gap: 8 (ref 5–15)
BUN: 16 mg/dL (ref 6–20)
CO2: 24 mmol/L (ref 22–32)
Calcium: 8.7 mg/dL — ABNORMAL LOW (ref 8.9–10.3)
Chloride: 104 mmol/L (ref 98–111)
Creatinine, Ser: 1.52 mg/dL — ABNORMAL HIGH (ref 0.44–1.00)
GFR, Estimated: 47 mL/min — ABNORMAL LOW (ref >60)
Glucose, Bld: 103 mg/dL — ABNORMAL HIGH (ref 70–99)
Potassium: 3.3 mmol/L — ABNORMAL LOW (ref 3.5–5.1)
Sodium: 136 mmol/L (ref 135–145)
Total Bilirubin: 0.5 mg/dL (ref 0.3–1.2)
Total Protein: 6.9 g/dL (ref 6.5–8.1)

## 2020-11-03 LAB — CBC
HCT: 36.5 % (ref 36.0–46.0)
Hemoglobin: 12.1 g/dL (ref 12.0–15.0)
MCH: 30.9 pg (ref 26.0–34.0)
MCHC: 33.2 g/dL (ref 30.0–36.0)
MCV: 93.1 fL (ref 80.0–100.0)
Platelets: 235 10*3/uL (ref 150–400)
RBC: 3.92 MIL/uL (ref 3.87–5.11)
RDW: 12.4 % (ref 11.5–15.5)
WBC: 9 10*3/uL (ref 4.0–10.5)
nRBC: 0 % (ref 0.0–0.2)

## 2020-11-03 LAB — I-STAT BETA HCG BLOOD, ED (MC, WL, AP ONLY): I-stat hCG, quantitative: <5 m[IU]/mL (ref <5)

## 2020-11-03 LAB — URINALYSIS, ROUTINE W REFLEX MICROSCOPIC
Bilirubin Urine: NEGATIVE
Glucose, UA: NEGATIVE mg/dL
Hgb urine dipstick: NEGATIVE
Ketones, ur: NEGATIVE mg/dL
Leukocytes,Ua: NEGATIVE
Nitrite: NEGATIVE
Protein, ur: NEGATIVE mg/dL
Specific Gravity, Urine: 1.01 (ref 1.005–1.030)
pH: 6 (ref 5.0–8.0)

## 2020-11-03 MED ORDER — ONDANSETRON HCL 4 MG/2ML IJ SOLN
4.0000 mg | Freq: Once | INTRAMUSCULAR | Status: AC
  Administered 2020-11-03: 4 mg via INTRAVENOUS
  Filled 2020-11-03: qty 2

## 2020-11-03 MED ORDER — HYDROCODONE-ACETAMINOPHEN 5-325 MG PO TABS
1.0000 | ORAL_TABLET | Freq: Four times a day (QID) | ORAL | 0 refills | Status: DC | PRN
Start: 2020-11-03 — End: 2023-06-11

## 2020-11-03 MED ORDER — HYDROMORPHONE HCL 1 MG/ML IJ SOLN
0.5000 mg | Freq: Once | INTRAMUSCULAR | Status: AC
  Administered 2020-11-03: 0.5 mg via INTRAVENOUS
  Filled 2020-11-03: qty 1

## 2020-11-03 MED ORDER — ONDANSETRON 4 MG PO TBDP: 4.0000 mg | ORAL_TABLET | Freq: Three times a day (TID) | ORAL | 0 refills | Status: DC | PRN

## 2020-11-03 MED ORDER — KETOROLAC TROMETHAMINE 30 MG/ML IJ SOLN
30.0000 mg | Freq: Once | INTRAMUSCULAR | Status: AC
  Administered 2020-11-03: 30 mg via INTRAVENOUS
  Filled 2020-11-03: qty 1

## 2020-11-03 MED ORDER — SODIUM CHLORIDE 0.9 % IV BOLUS
1000.0000 mL | Freq: Once | INTRAVENOUS | Status: AC
  Administered 2020-11-03: 1000 mL via INTRAVENOUS

## 2020-11-03 MED ORDER — HYDROCODONE-ACETAMINOPHEN 5-325 MG PO TABS
2.0000 | ORAL_TABLET | ORAL | 0 refills | Status: DC | PRN
Start: 2020-11-03 — End: 2020-11-03

## 2020-11-03 NOTE — ED Provider Notes (Signed)
Patient received from Arthor Captain, PA-C at shift change pending urology consult. See her note for full HPI.  In short, patient is a 29 year old female who presents to the ED due to right flank pain.  Patient was diagnosed with a 4 mm right UVJ stone on 10/29/2020 and return to the ED due to worsening pain.  Patient has attempted to follow-up with urology; however, soonest appointment is 11/17/20.   Plan from previous provider: Pain management and discuss with urology for possible intervention.    ED Course/Procedures     Results for orders placed or performed during the hospital encounter of 11/03/20 (from the past 24 hour(s))  Comprehensive metabolic panel     Status: Abnormal   Collection Time: 11/03/20 11:30 AM  Result Value Ref Range   Sodium 136 135 - 145 mmol/L   Potassium 3.3 (L) 3.5 - 5.1 mmol/L   Chloride 104 98 - 111 mmol/L   CO2 24 22 - 32 mmol/L   Glucose, Bld 103 (H) 70 - 99 mg/dL   BUN 16 6 - 20 mg/dL   Creatinine, Ser 4.16 (H) 0.44 - 1.00 mg/dL   Calcium 8.7 (L) 8.9 - 10.3 mg/dL   Total Protein 6.9 6.5 - 8.1 g/dL   Albumin 4.3 3.5 - 5.0 g/dL   AST 15 15 - 41 U/L   ALT 13 0 - 44 U/L   Alkaline Phosphatase 38 38 - 126 U/L   Total Bilirubin 0.5 0.3 - 1.2 mg/dL   GFR, Estimated 47 (L) >60 mL/min   Anion gap 8 5 - 15  CBC     Status: None   Collection Time: 11/03/20 11:30 AM  Result Value Ref Range   WBC 9.0 4.0 - 10.5 K/uL   RBC 3.92 3.87 - 5.11 MIL/uL   Hemoglobin 12.1 12.0 - 15.0 g/dL   HCT 60.6 36 - 46 %   MCV 93.1 80.0 - 100.0 fL   MCH 30.9 26.0 - 34.0 pg   MCHC 33.2 30.0 - 36.0 g/dL   RDW 30.1 60.1 - 09.3 %   Platelets 235 150 - 400 K/uL   nRBC 0.0 0.0 - 0.2 %  I-Stat beta hCG blood, ED     Status: None   Collection Time: 11/03/20 11:36 AM  Result Value Ref Range   I-stat hCG, quantitative <5.0 <5 mIU/mL   Comment 3          Urinalysis, Routine w reflex microscopic Urine, Clean Catch     Status: Abnormal   Collection Time: 11/03/20  1:41 PM  Result  Value Ref Range   Color, Urine STRAW (A) YELLOW   APPearance CLEAR CLEAR   Specific Gravity, Urine 1.010 1.005 - 1.030   pH 6.0 5.0 - 8.0   Glucose, UA NEGATIVE NEGATIVE mg/dL   Hgb urine dipstick NEGATIVE NEGATIVE   Bilirubin Urine NEGATIVE NEGATIVE   Ketones, ur NEGATIVE NEGATIVE mg/dL   Protein, ur NEGATIVE NEGATIVE mg/dL   Nitrite NEGATIVE NEGATIVE   Leukocytes,Ua NEGATIVE NEGATIVE     Procedures  MDM  Patient received from Arthor Captain, PA-C at shift change pending urology consultation.  See her note for full MDM.  29 year old female presents to the ED due to known 4 mm kidney stone which was diagnosed on 10/29/20.  Patient returns to the ED today due to continued worsening flank pain.  Upon arrival, patient is afebrile, tachycardic in the low 100s, and not hypoxic.  Patient in no acute distress and nontoxic-appearing.  I have  personally reviewed all labs from previous provider. Patient's pain treated with Toradol and Dilaudid by previous provider.  Pregnancy test negative.  CMP significant for mild hypokalemia at 3.3.  Elevated creatinine at 1.52, but otherwise reassuring.  UA negative for signs of infection.  No concern for infected kidney stone at this time.  CBC reassuring with no leukocytosis and normal hemoglobin.  Ultrasound personally reviewed which demonstrates: IMPRESSION:  Mild right hydronephrosis.    No parenchymal calculi.   Discussed case with Dr. Marlou Porch with urology who notes that patient would prefer to be discharged to follow-up with [facility] urology. Will discharge with pain medication and zofran. Drug database reviewed prior to prescription. Instructed patient to save pain medication for severe pain. Patient already has prescription for flomax. Advised patient to call [facility] urologist tomorrow for further evaluation. Strict ED precautions discussed with patient. Patient states understanding and agrees to plan. Patient discharged home in no acute distress and  stable vitals.   Mannie Stabile, PA-C 11/03/20 1757    Sabino Donovan, MD 11/07/20 1455

## 2020-11-03 NOTE — ED Provider Notes (Signed)
[facility]-EMERGENCY DEPT Provider Note   CSN: [id number] Arrival date & time: 11/03/20  1111     History No chief complaint on file.   Barbara Pham is a 29 y.o. female who presents with a cc of R flank pain. She was diagnosed with a 81mm R uvj stone on 10/29/2020. She has had intermittent pain and has never fully been comfortable, however today at work her pain became severe. She has tried to f/u with Urology but her first available appt isn't until 11/17/20. Patient denies fever. Pain is severe, colicky, waxing and waning.   HPI     Past Medical History:  Diagnosis Date  . Acid reflux   . Cat allergies   . Kidney stones   . Multiple allergies    dust and pollen  . Pollen allergies   . Postpartum care following cesarean delivery (8/1) 06/29/2016    Patient Active Problem List   Diagnosis Date Noted  . Postpartum care following cesarean delivery (8/1) 06/29/2016  . Severe preeclampsia 06/28/2016  . Preeclampsia 06/23/2016  . Reflux 05/07/2012    Past Surgical History:  Procedure Laterality Date  . CESAREAN SECTION N/A 06/28/2016   Procedure: CESAREAN SECTION;  Surgeon: Noland Fordyce, MD;  Location: [facility] BIRTHING SUITES;  Service: Obstetrics;  Laterality: N/A;  . feet surgery     had to stretch bones due to growth plates closing too soon  . TONSILLECTOMY AND ADENOIDECTOMY       OB History    Gravida  1   Para  1   Term      Preterm  1   AB      Living  1     SAB      TAB      Ectopic      Multiple  0   Live Births  1           No family history on file.  Social History   Tobacco Use  . Smoking status: Never Smoker  . Smokeless tobacco: Never Used  Substance Use Topics  . Alcohol use: Yes    Comment: occassionl  . Drug use: No    Home Medications Prior to Admission medications   Medication Sig Start Date End Date Taking? Authorizing Provider  Doxylamine-Pyridoxine (DICLEGIS) 10-10 MG TBEC Take two tablets by  mouth at bedtime as needed for morning sickness 01/11/16   [name], NP  etonogestrel-ethinyl estradiol (NUVARING) 0.12-0.015 MG/24HR vaginal ring every 30 (thirty) days. 08/07/20   [provider]  ibuprofen (ADVIL,MOTRIN) 600 MG tablet Take 1 tablet (600 mg total) by mouth every 6 (six) hours. 07/01/16   Marlinda Mike., CNM  ketorolac (TORADOL) 10 MG tablet Take 1 tablet (10 mg total) by mouth every 6 (six) hours as needed. 11/28/19   Garlon Hatchet, PA-C  omeprazole (PRILOSEC) 20 MG capsule Take 20 mg by mouth daily as needed (For heartburn.).    [provider]  ondansetron (ZOFRAN) 4 MG tablet Take 1 tablet (4 mg total) by mouth every 6 (six) hours for 20 doses. 10/29/20 11/03/20  Curatolo, Adam, DO  oxyCODONE (ROXICODONE) 5 MG immediate release tablet Take 1 tablet (5 mg total) by mouth every 4 (four) hours as needed for up to 20 doses for severe pain. 10/29/20   Curatolo, Adam, DO  oxyCODONE-acetaminophen (PERCOCET/ROXICET) 5-325 MG tablet Take 1-2 tablets by mouth every 4 (four) hours as needed for moderate pain. 07/01/16   Marlinda Mike., CNM  Prenatal Vit-Fe Fumarate-FA (PRENATAL MULTIVITAMIN) TABS tablet Take 1 tablet by mouth at bedtime.     [provider]  promethazine (PHENERGAN) 25 MG suppository Place 1 suppository (25 mg total) rectally every 6 (six) hours as needed for up to 12 doses for nausea or vomiting. 10/29/20   Curatolo, Adam, DO  tamsulosin (FLOMAX) 0.4 MG CAPS capsule Take 1 capsule (0.4 mg total) by mouth daily for 10 doses. 10/29/20 11/08/20  Curatolo, Adam, DO  Wheat Dextrin-Calcium ([employer] EASY FIBER/CALCIUM PO) Take 1 tablet by mouth daily.    [provider]    Allergies    Patient has no known allergies.  Review of Systems   Review of Systems Ten systems reviewed and are negative for acute change, except as noted in the HPI.   Physical Exam Updated Vital Signs BP 126/86   Pulse (!) 101   Temp 99.1 F (37.3 C) (Oral)   Resp 18    Ht 5\' 1"  (1.549 m)   Wt 56.7 kg   SpO2 100%   BMI 23.62 kg/m   Physical Exam Vitals and nursing note reviewed.  Constitutional:      General: She is not in acute distress.    Appearance: She is well-developed. She is not diaphoretic.  HENT:     Head: Normocephalic and atraumatic.  Eyes:     General: No scleral icterus.    Conjunctiva/sclera: Conjunctivae normal.  Cardiovascular:     Rate and Rhythm: Normal rate and regular rhythm.     Heart sounds: Normal heart sounds. No murmur heard.  No friction rub. No gallop.   Pulmonary:     Effort: Pulmonary effort is normal. No respiratory distress.     Breath sounds: Normal breath sounds.  Abdominal:     General: Bowel sounds are normal. There is no distension.     Palpations: Abdomen is soft. There is no mass.     Tenderness: There is no abdominal tenderness. There is right CVA tenderness. There is no guarding.  Musculoskeletal:     Cervical back: Normal range of motion.  Skin:    General: Skin is warm and dry.  Neurological:     Mental Status: She is alert and oriented to person, place, and time.  Psychiatric:        Behavior: Behavior normal.     ED Results / Procedures / Treatments   Labs (all labs ordered are listed, but only abnormal results are displayed) Labs Reviewed  COMPREHENSIVE METABOLIC PANEL - Abnormal; Notable for the following components:      Result Value   Potassium 3.3 (*)    Glucose, Bld 103 (*)    Creatinine, Ser 1.52 (*)    Calcium 8.7 (*)    GFR, Estimated 47 (*)    All other components within normal limits  CBC  URINALYSIS, ROUTINE W REFLEX MICROSCOPIC  I-STAT BETA HCG BLOOD, ED ([facility], [facility], [facility] ONLY)    EKG None  Radiology No results found.  Procedures Procedures (including critical care time)  Medications Ordered in ED Medications  ondansetron (ZOFRAN) injection 4 mg (4 mg Intravenous Given 11/03/20 1210)  HYDROmorphone (DILAUDID) injection 0.5 mg (0.5 mg Intravenous Given 11/03/20  1210)    ED Course  I have reviewed the triage vital signs and the nursing notes.  Pertinent labs & imaging results that were available during my care of the patient were reviewed by me and considered in my medical decision making (see chart for details).    MDM  Rules/Calculators/A&P                         ON:GEXBM pain VS: BP 126/86   Pulse (!) 104   Temp 99.1 F (37.3 C) (Oral)   Resp 16   Ht 5\' 1"  (1.549 m)   Wt 56.7 kg   SpO2 98%   BMI 23.62 kg/m   is gathered by pt and emr. Previous records obtained and reviewed. DDX:The patient's complaint of flank paininvolves an extensive number of diagnostic and treatment options, and is a complaint that carries with it a high risk of complications, morbidity, and potential mortality. Given the large differential diagnosis, medical decision making is of high complexity.The differential diagnosis of emergent flank pain includes, but is not limited to :Abdominal aortic aneurysm,, Renal artery embolism,Renal vein thrombosis, Aortic dissection, Mesenteric ischemia, Pyelonephritis, Renal infarction, Renal hemorrhage, Nephrolithiasis/ Renal Colic, Bladder tumor,Cystitis, Biliary colic, Pancreatitis Perforated peptic ulcer Appendicitis ,Inguinal Hernia, Diverticulitis, Bowel obstruction Shingles Lower lobe pneumonia, Retroperitoneal hematoma/abscess/tumor, Epidural abscess, Epidural hematoma  Labs: I ordered reviewed and interpreted labs which include Cbc wnl cmp- chronic renal insufficieny, unchanged hcg-neg UA- w/o UA Imaging: I ordered and reviewed images which included renal WU:XLKGMWN. I independently visualized and interpreted all imaging. Significant findings include persistent moderate Hydronephrosis on the R .  EKG: Consults:I spoke with Dr. [place] who will come and see the patient. He agrees with repeat pain control and a bolus of fluid.  MDM: patient here with R uvj stone. Poor pain control awaiting consult from Dr. Marlou Porch.   Sign out given to PA Aberman. Dispo based on consult.          Final Clinical Impression(s) / ED Diagnoses Final diagnoses:  None    Rx / DC Orders ED Discharge Orders    None       Marlou Porch, PA-C 11/04/20 0847    14/08/21, MD 11/04/20 [phone]

## 2020-11-03 NOTE — Discharge Instructions (Addendum)
As discussed, I am sending home with some more pain medication.  Safe for severe pain.  Continue take ibuprofen and Tylenol around-the-clock.  Continue to drink a lot of fluids.  Continue taking Flomax.  I am also sending you with more Zofran.  Please follow-up with urologist if pain does not improve within the next week.  Return to the ER for new or worsening symptoms.

## 2020-11-03 NOTE — ED Triage Notes (Signed)
Patient states she has a kidney stone, endorses R flank and abdominal pain, endorses pain w/ urination and abdominal and leg swelling since 11/25. Hx of kidney stones. Took pain meds, Zofran and Flomax this AM, no relief.

## 2021-09-02 IMAGING — US US TRANSVAGINAL NON-OB
1 series · 13 of 25 positions shown · non-contrast
Comparison: None available.

CLINICAL DATA: Initial evaluation for acute left adnexal
tenderness.

EXAM:
TRANSABDOMINAL AND TRANSVAGINAL ULTRASOUND OF PELVIS
DOPPLER ULTRASOUND OF OVARIES
TECHNIQUE: Both transabdominal and transvaginal ultrasound examinations of the
pelvis were performed. Transabdominal technique was performed for
global imaging of the pelvis including uterus, ovaries, adnexal
regions, and pelvic cul-de-sac.
It was necessary to proceed with endovaginal exam following the
transabdominal exam to visualize the uterus, endometrium, and
ovaries. Color and duplex Doppler ultrasound was utilized to
evaluate blood flow to the ovaries.

[Series 1: us transvaginal non-ob · 97 acquisitions, 13 frames shown]
[im 1/97]
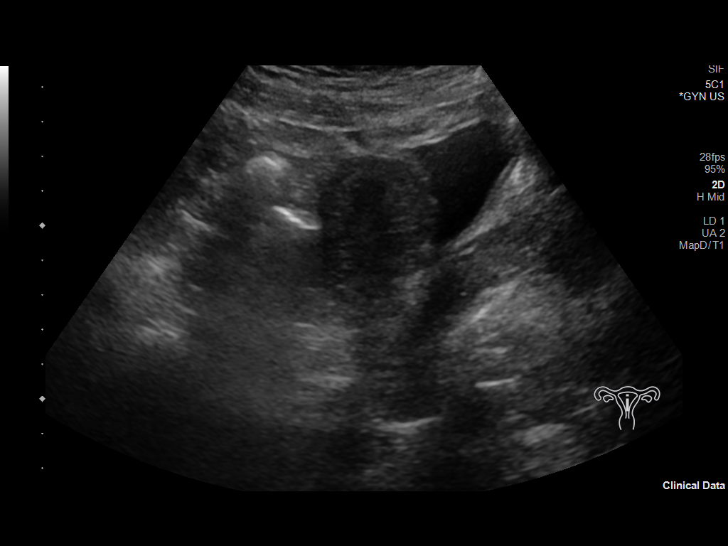
[im 9/97]
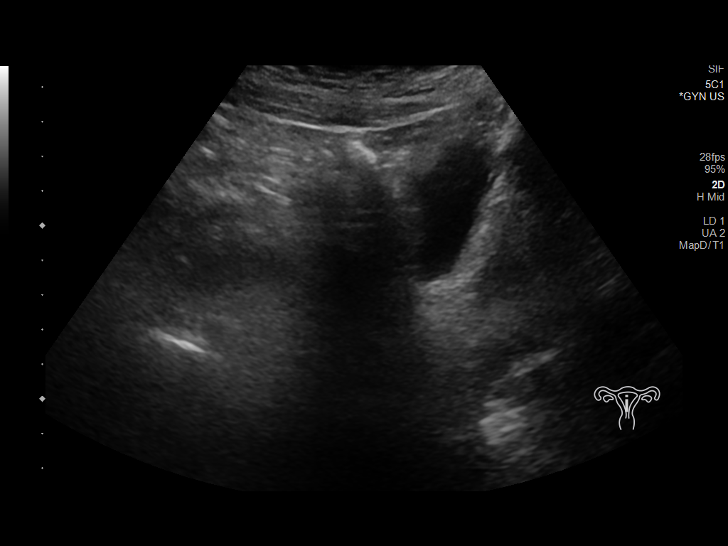
[im 17/97]
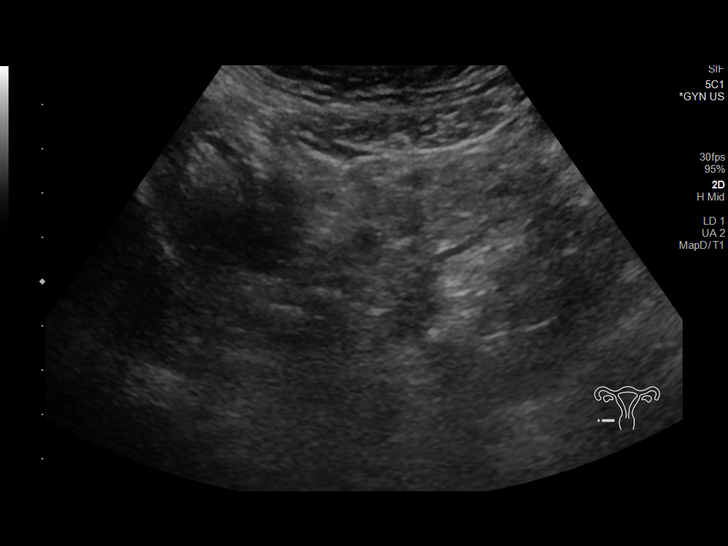
[im 25/97]
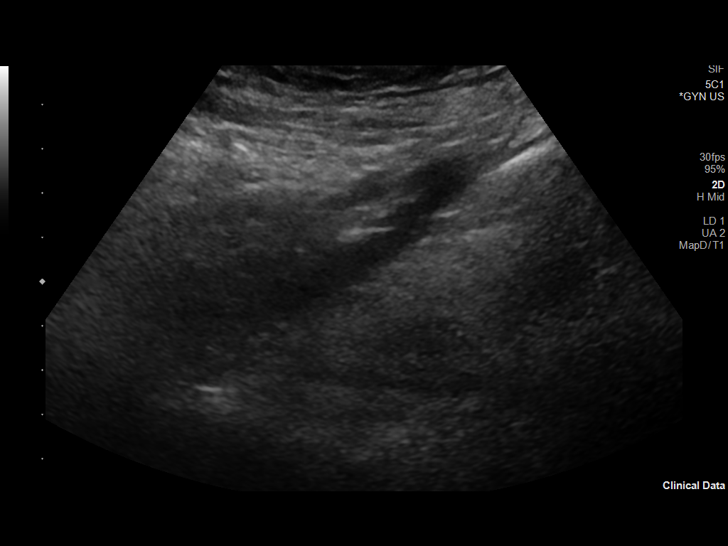
[im 33/97]
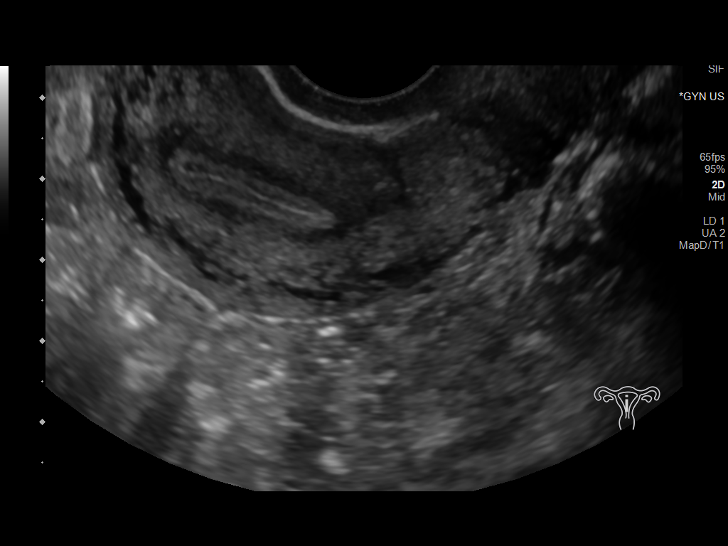
[im 41/97]
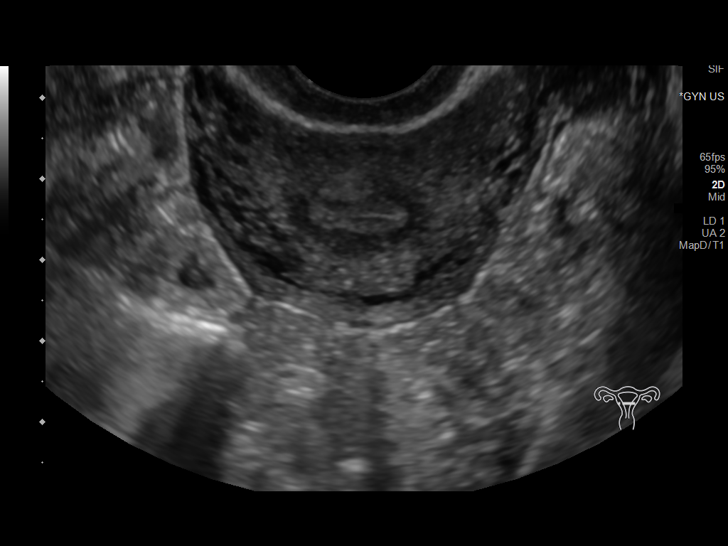
[im 49/97]
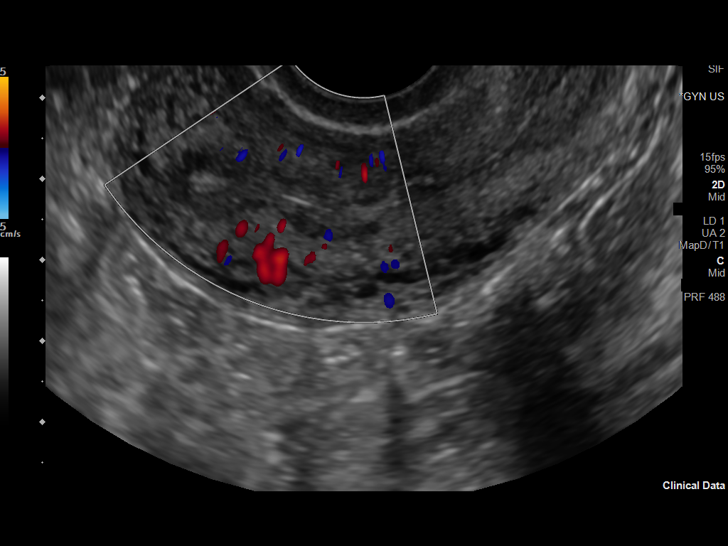
[im 57/97]
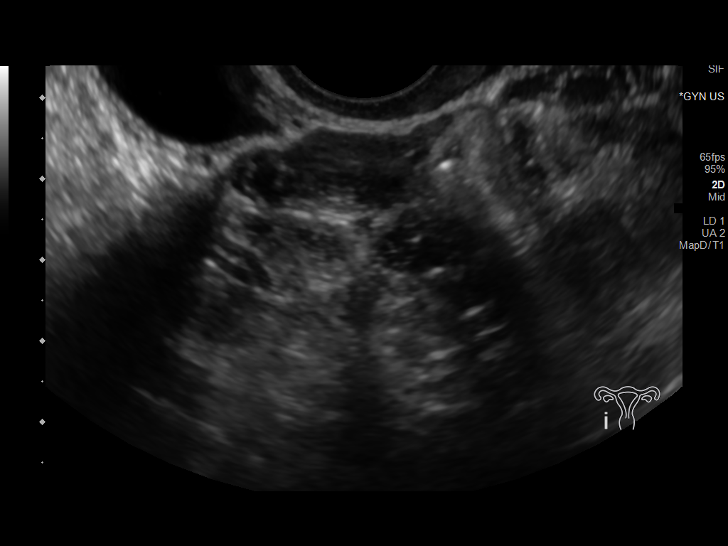
[im 65/97]
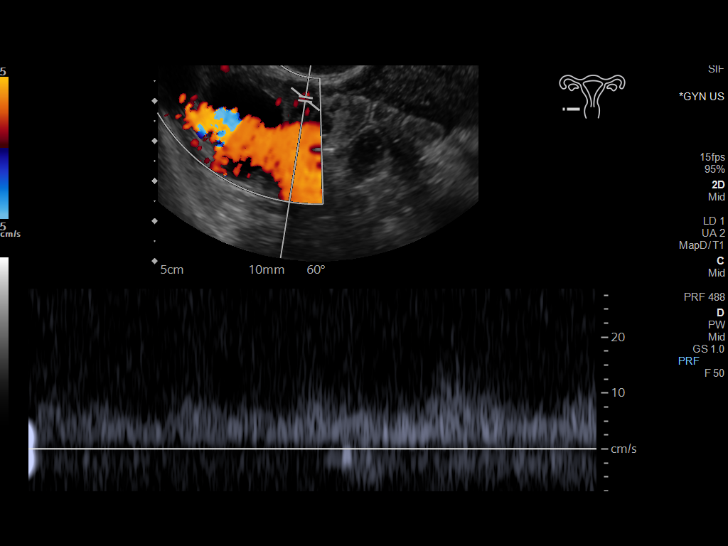
[im 73/97]
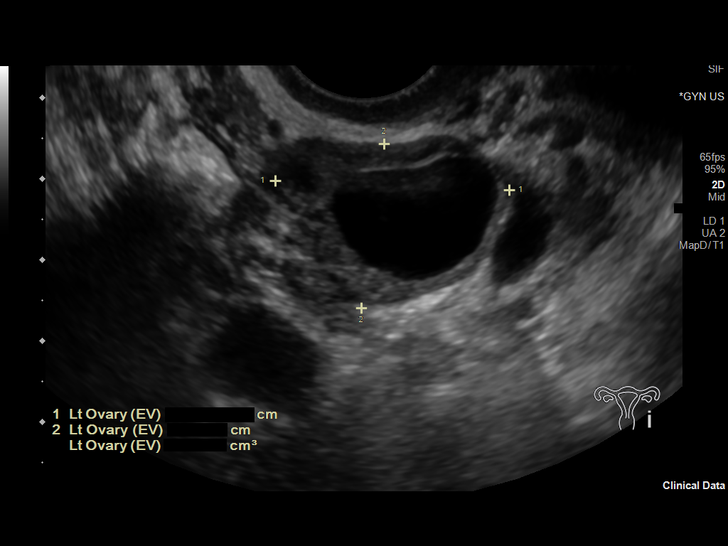
[im 81/97]
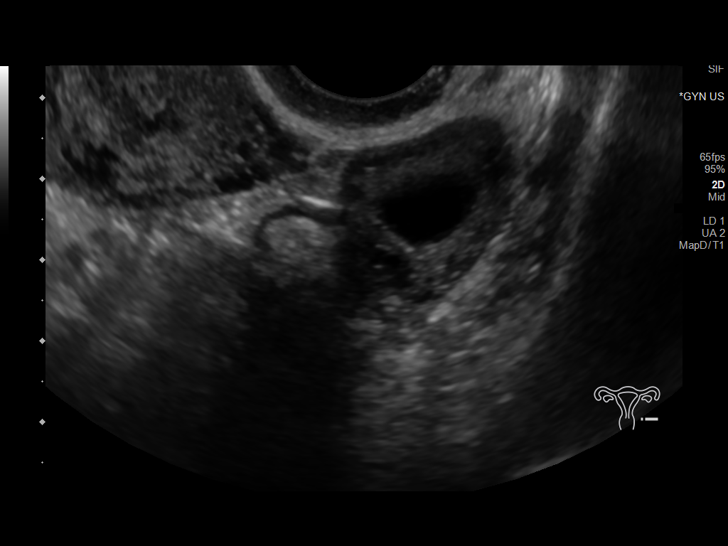
[im 89/97]
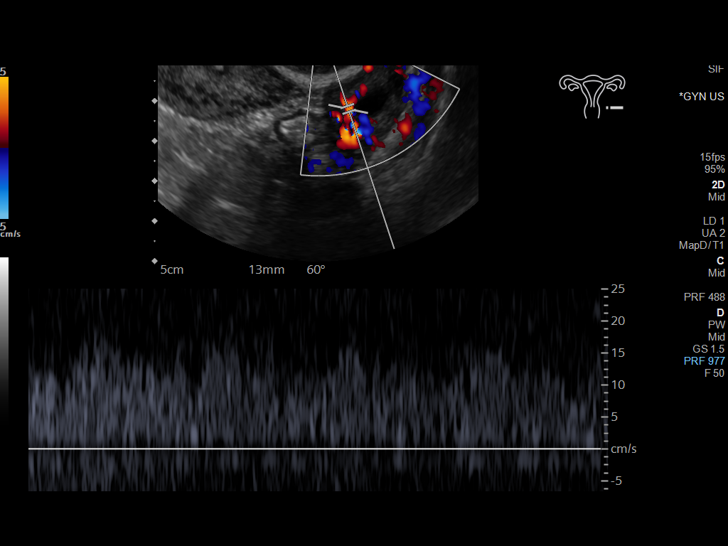
[im 97/97]
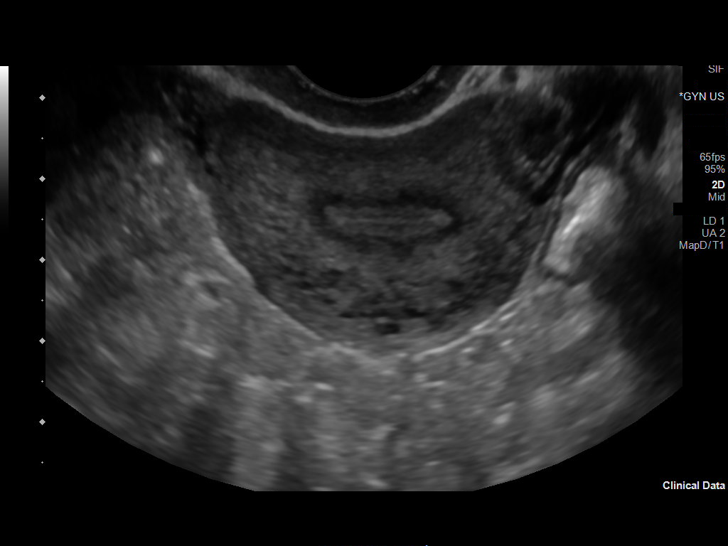

[13 of 25 positions shown; findings below may reference images not displayed]

FINDINGS: Uterus

Measurements: 7.8 x 3.1 x 4.1 cm = volume: 51.3 mL. No fibroids or
other mass visualized.

Endometrium

Thickness: 5 mm.  No focal abnormality visualized.

Right ovary

Measurements: 3.0 x 1.9 x 2.8 cm = volume: 8.4 mL. Normal
appearance/no adnexal mass.

Left ovary

Measurements: 2.9 x 2.1 x 2.1 cm = volume: 6.6 mL. Normal
appearance/no adnexal mass.

Pulsed Doppler evaluation of both ovaries demonstrates normal
low-resistance arterial and venous waveforms.

Other findings

No abnormal free fluid.
IMPRESSION: Normal pelvic ultrasound. No evidence for torsion or other acute
abnormality.

## 2022-08-05 IMAGING — CT CT ABD-PELV W/ CM
2 of 4 series · 16 of 46 positions shown, 18 images · IV contrast (omnipaque)
Comparison: CT 11/27/2019

CLINICAL DATA: Appendicitis versus urolithiasis, right flank pain
and difficulty urinating

EXAM:
CT ABDOMEN AND PELVIS WITH CONTRAST
TECHNIQUE: Multidetector CT imaging of the abdomen and pelvis was performed
using the standard protocol following bolus administration of
intravenous contrast.
CONTRAST:  100mL OMNIPAQUE IOHEXOL 300 MG/ML  SOLN

[Series 3: abd/ pelvis 5.0 i30f 2 · axial · 0.75mm/px · z∈[+671,+1046]mm · 13 of 83 slices shown, 15 images]
[im 4/83  soft-tissue]
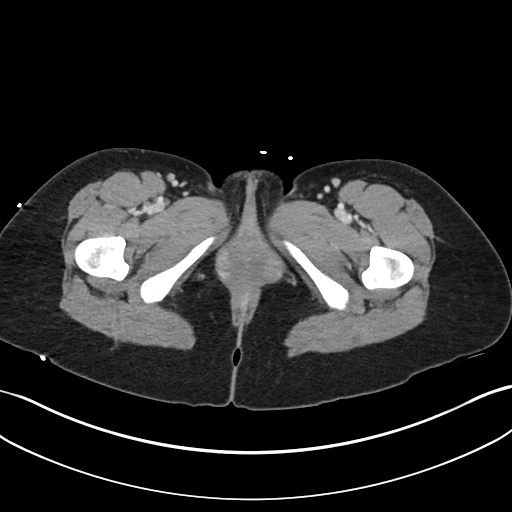
[im 4/83  bone]
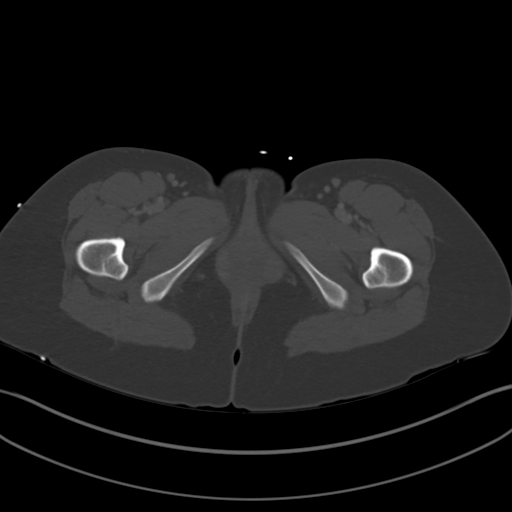
[im 11/83  soft-tissue]
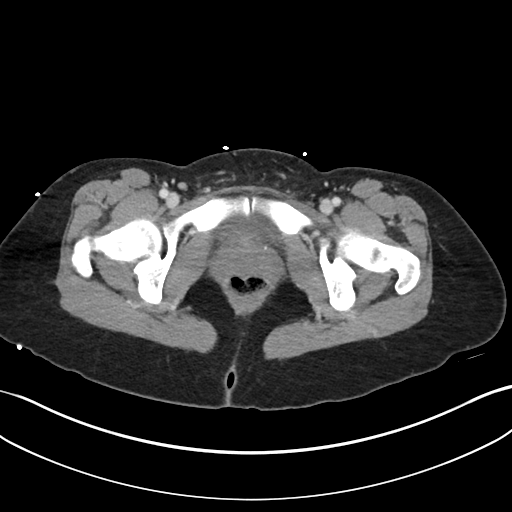
[im 18/83  soft-tissue]
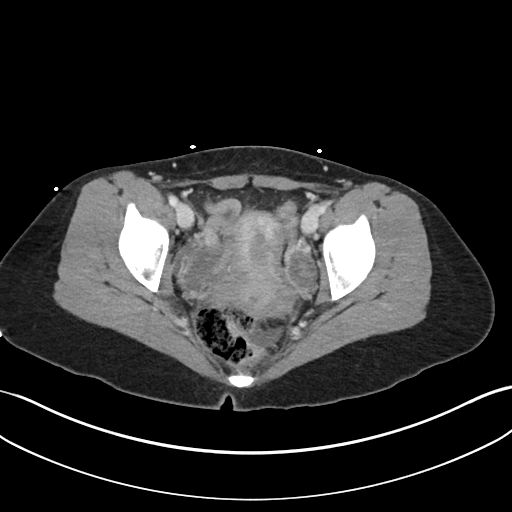
[im 22/83  soft-tissue]
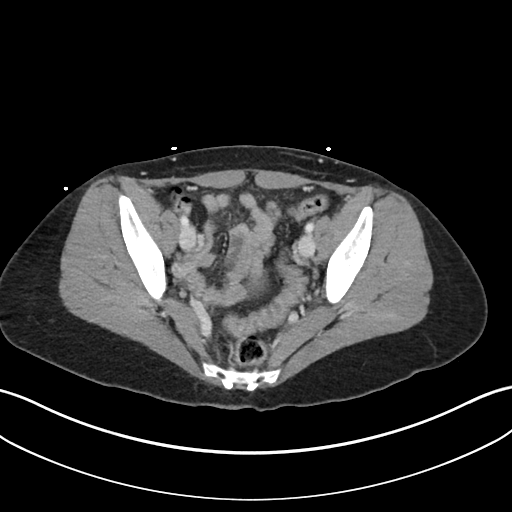
[im 29/83  soft-tissue]
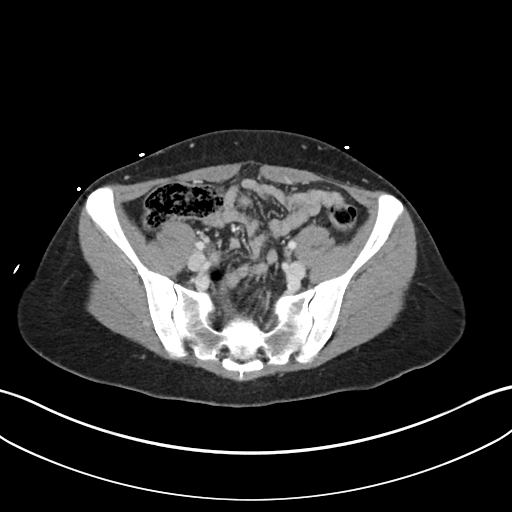
[im 36/83  soft-tissue]
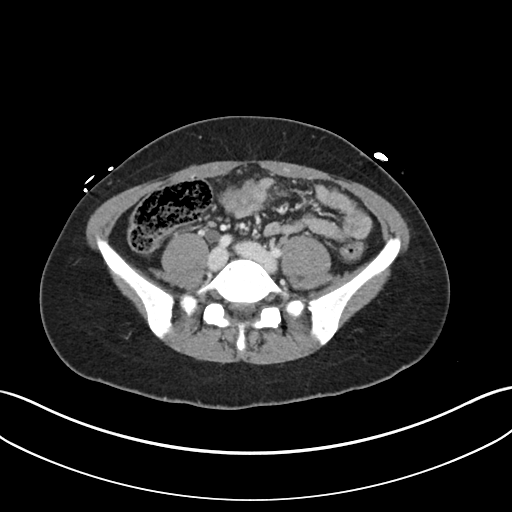
[im 43/83  soft-tissue]
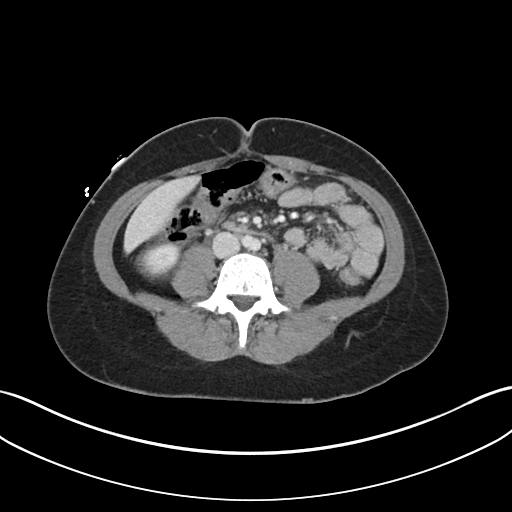
[im 47/83  soft-tissue]
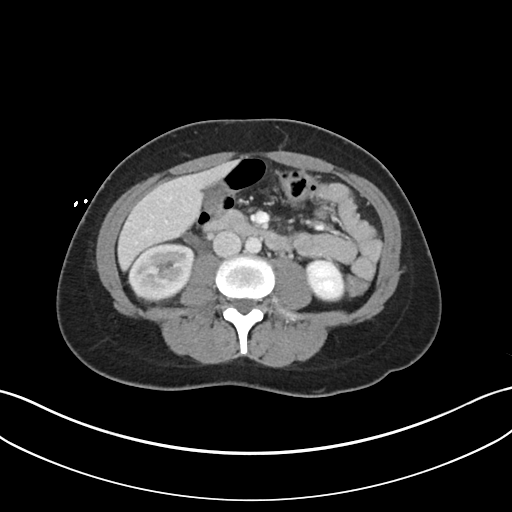
[im 54/83  soft-tissue]
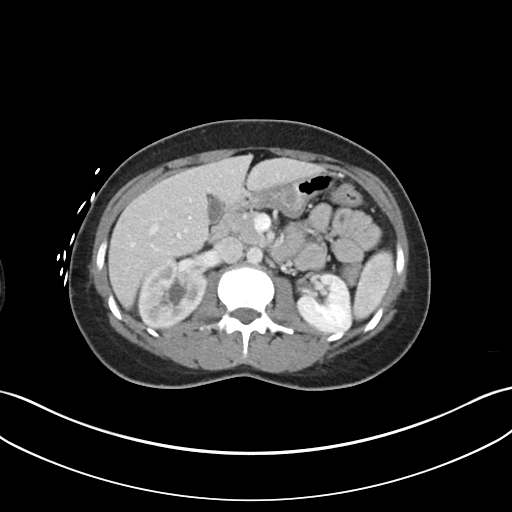
[im 54/83  bone]
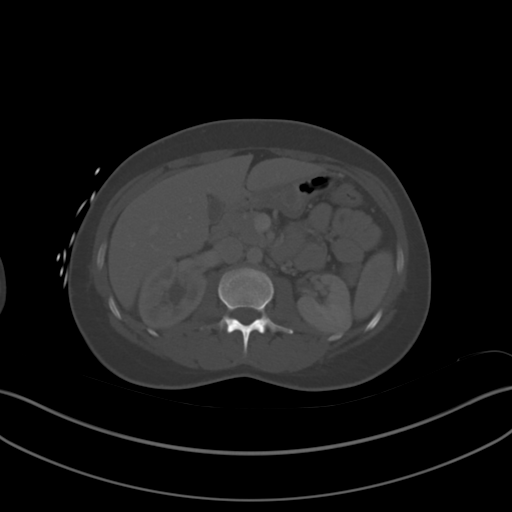
[im 61/83  soft-tissue]
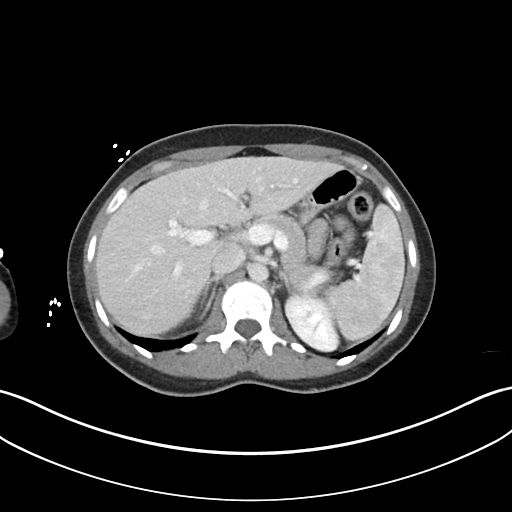
[im 65/83  soft-tissue]
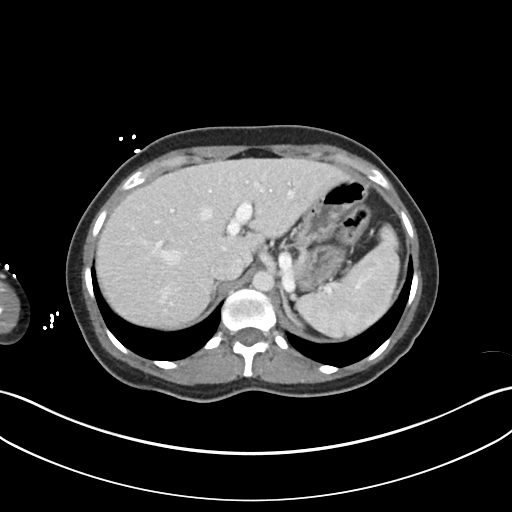
[im 72/83  soft-tissue]
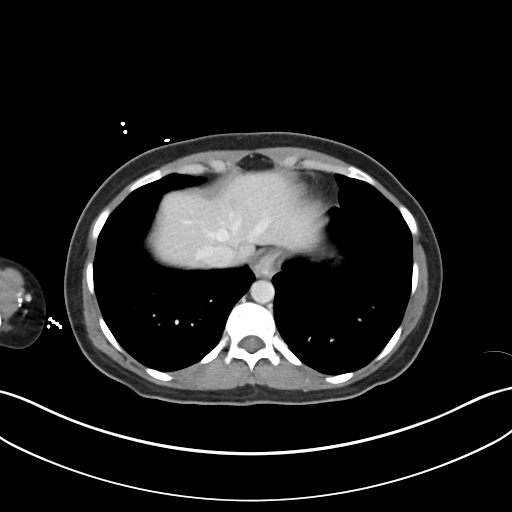
[im 79/83  soft-tissue]
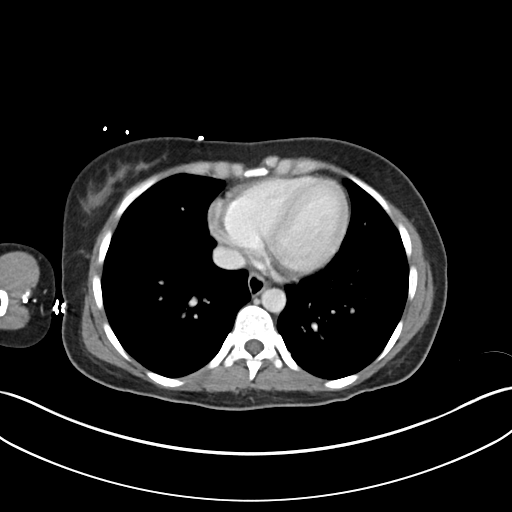

[Series 6: coronal soft tissue · coronal · 0.71mm/px · 3 of 98 slices shown]
[im 33/98  soft-tissue]
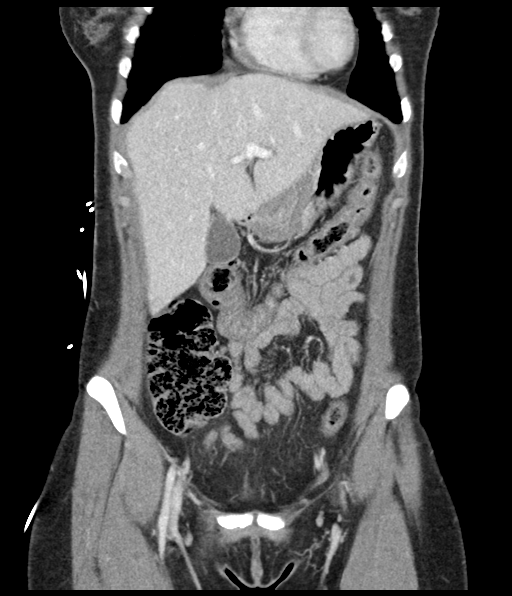
[im 44/98  soft-tissue]
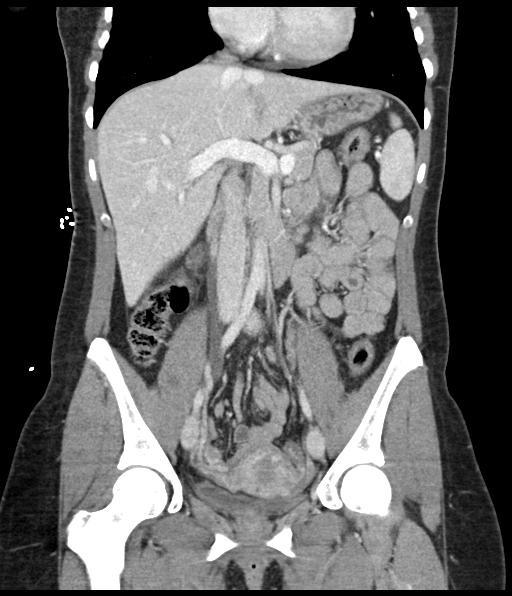
[im 54/98  soft-tissue]
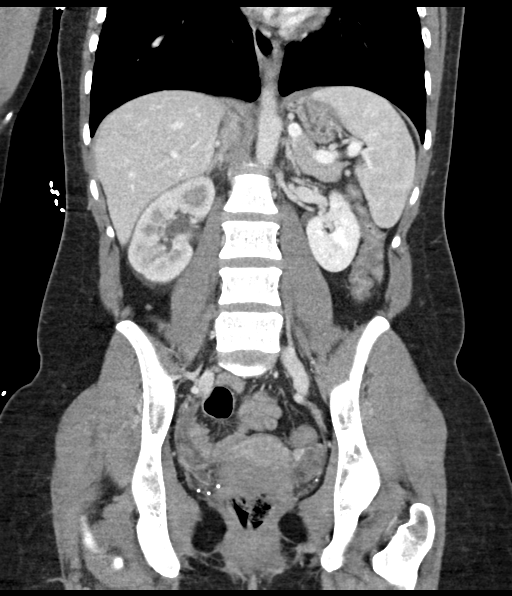

[16 of 46 positions shown; findings below may reference images not displayed]

FINDINGS: Lower chest: Lung bases are clear. Normal heart size. No pericardial
effusion.

Hepatobiliary: No worrisome focal liver lesions. Smooth liver
surface contour. Normal hepatic attenuation. Normal gallbladder
biliary tree.

Pancreas: No pancreatic ductal dilatation or surrounding
inflammatory changes.

Spleen: Normal in size. No concerning splenic lesions.

Adrenals/Urinary Tract: Normal adrenal glands. No concerning renal
mass. Asymmetrically delayed right renal nephrogram with perinephric
stranding and moderate hydroureteronephrosis to the level of the
right ureterovesicular junction where a 4 mm calculus is present
(3/69). Several adjacent calcifications appear external to the
ureter, unchanged from prior and are most compatible with
phleboliths. No other visible urolithiasis or left urinary tract
dilatation. Urinary bladder is largely decompressed at the time of
exam and therefore poorly evaluated by CT imaging. No gross bladder
abnormality is seen otherwise.

Stomach/Bowel: Distal esophagus, stomach and duodenal sweep are
unremarkable. No small bowel wall thickening or dilatation. Normal
appendix in the right lower quadrant coursing across the right psoas
in terminating near the pelvic sidewall (3/60-68). No colonic
dilatation or wall thickening. No evidence of obstruction.

Vascular/Lymphatic: No significant vascular findings are present. No
enlarged abdominal or pelvic lymph nodes.

Reproductive: Anteverted uterus. Collapsing corpus luteum in the
right ovary, benign physiologic finding. No worrisome adnexal
lesions.

Other: Right perinephric periureteral stranding. Trace
low-attenuation fluid seen in the posterior cul-de-sac/rectovaginal
pouch. No free air.

Musculoskeletal: No acute osseous abnormality or suspicious osseous
lesion. Musculature is normal and symmetric.
IMPRESSION: 1. 4 mm calculus at the right ureterovesicular junction with
associated moderate right hydroureteronephrosis, perinephric and
periureteral stranding and delayed right renal nephrogram.
2. Normal appendix.

## 2022-08-10 IMAGING — US US RENAL
1 series · 14 of 25 positions shown · non-contrast
Comparison: 10/29/2020 CT abdomen pelvis and prior.

CLINICAL DATA: Flank pain- known kidney stone- unresolved sx

EXAM:
RENAL / URINARY TRACT ULTRASOUND COMPLETE

[Series 1: us renal · 14 of 32 slices shown]
[im 1/32]
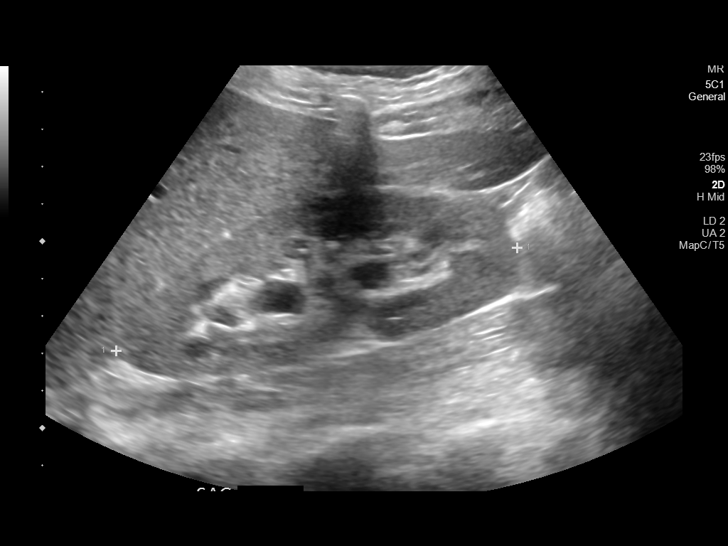
[im 3/32]
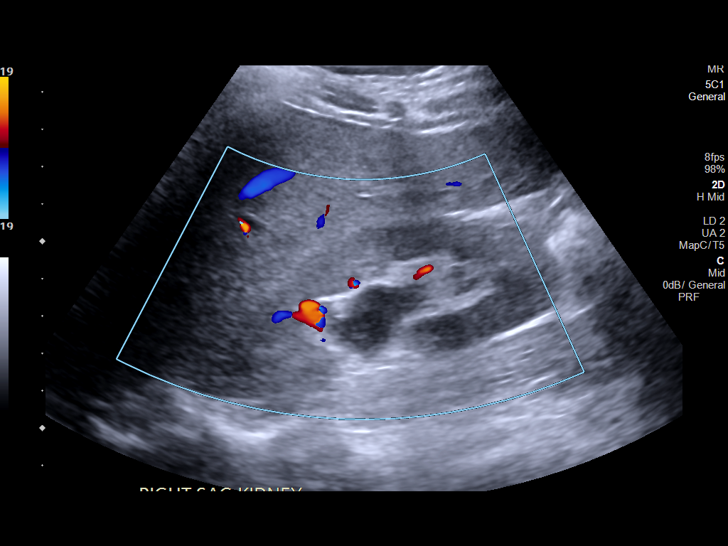
[im 6/32]
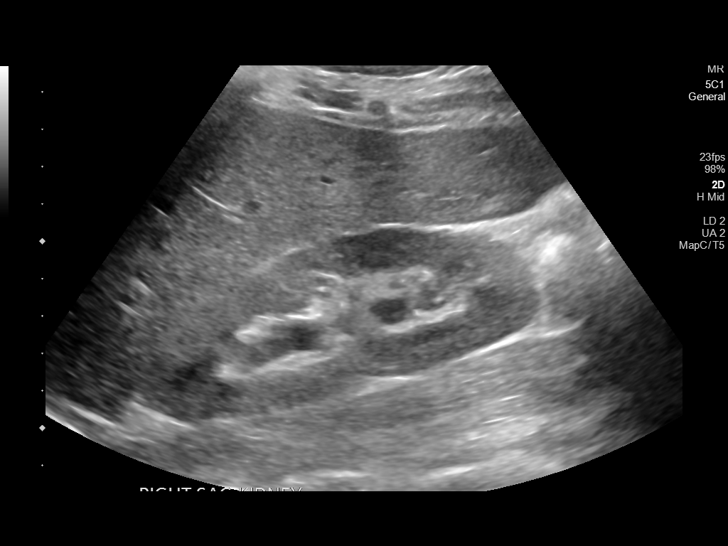
[im 8/32]
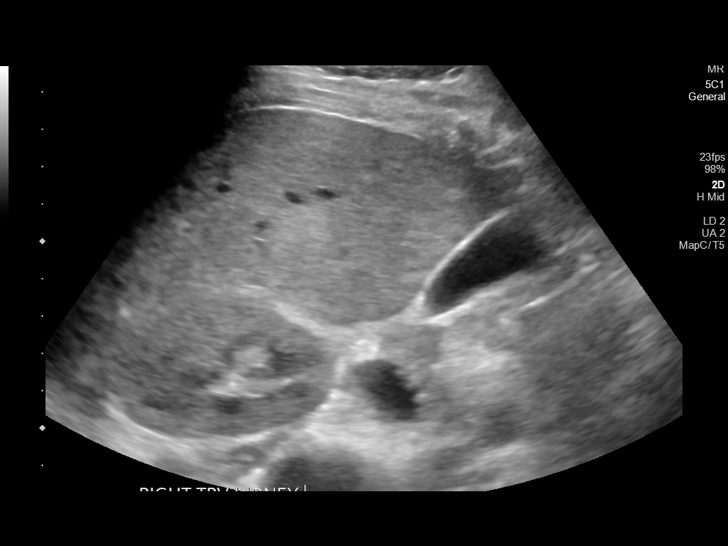
[im 11/32]
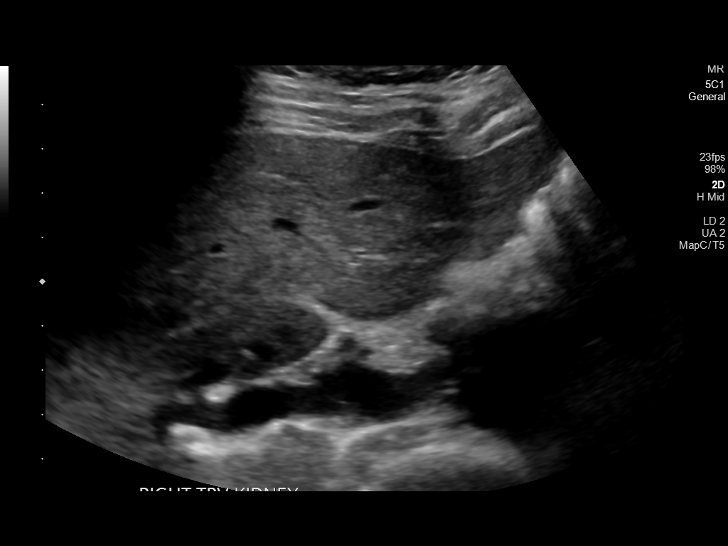
[im 12/32]
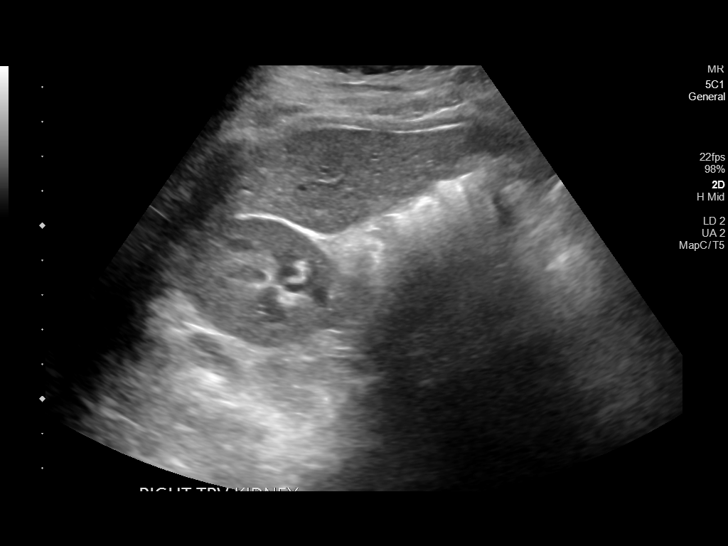
[im 15/32]
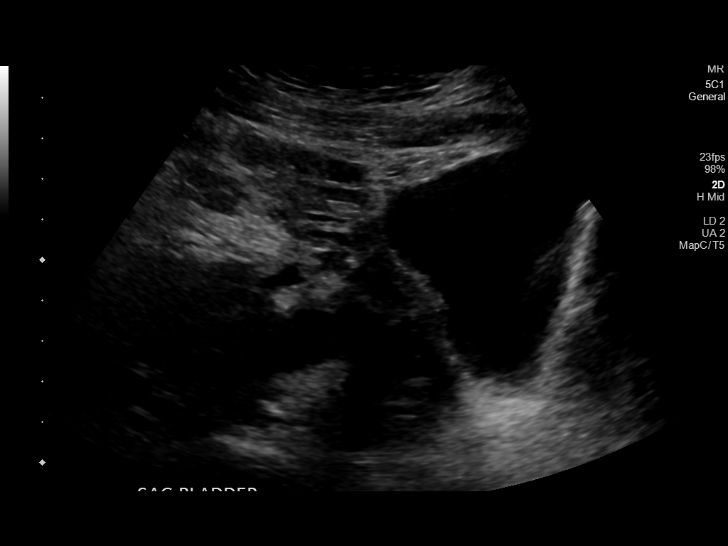
[im 17/32]
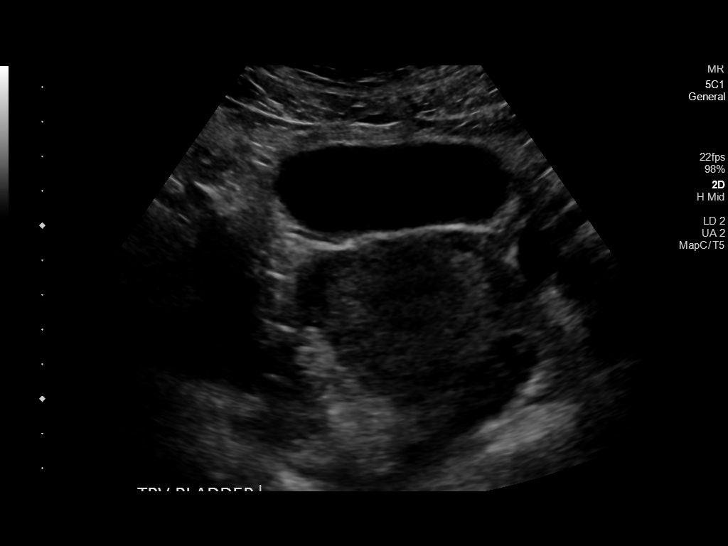
[im 20/32]
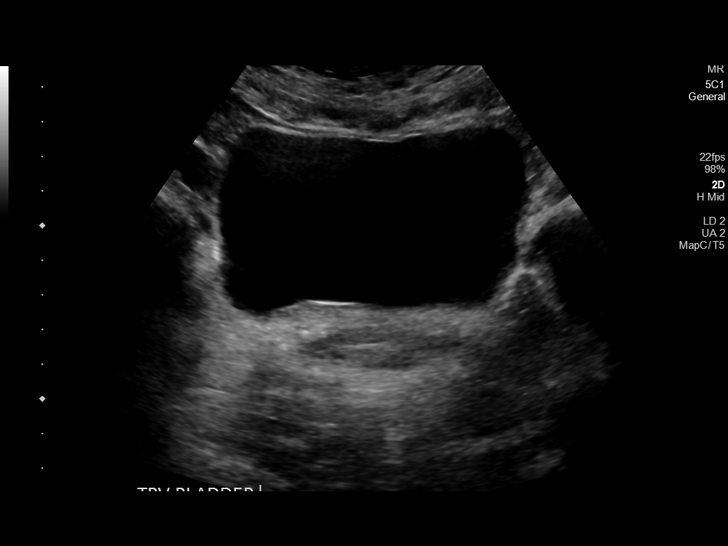
[im 21/32]
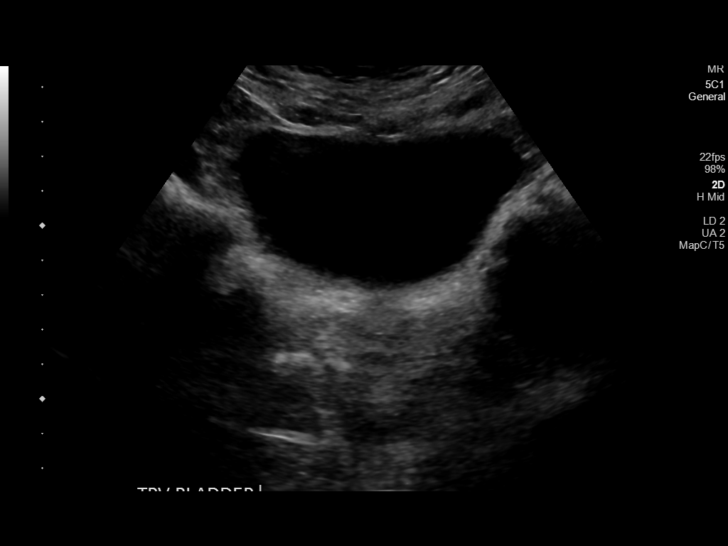
[im 24/32]
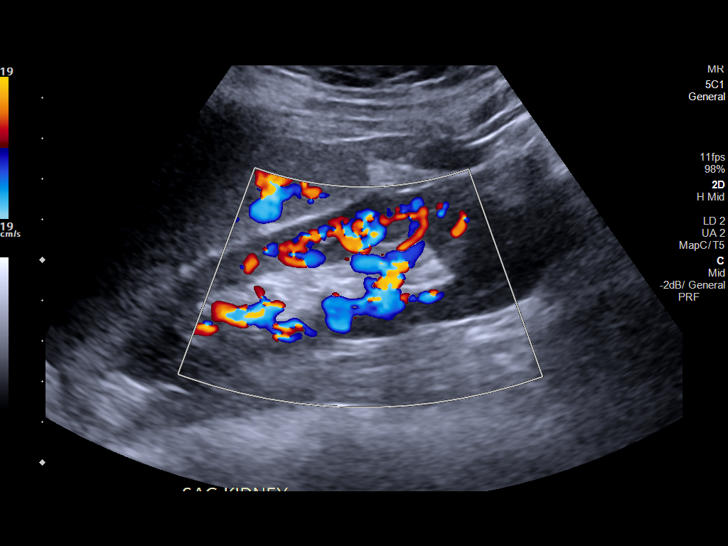
[im 26/32]
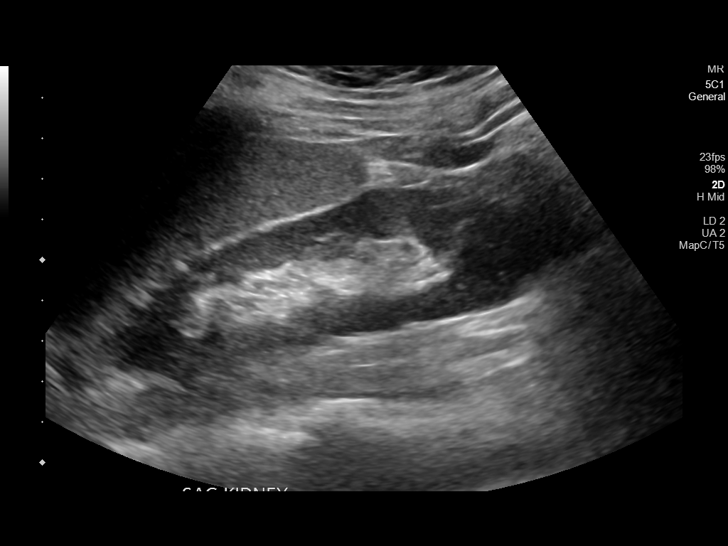
[im 29/32]
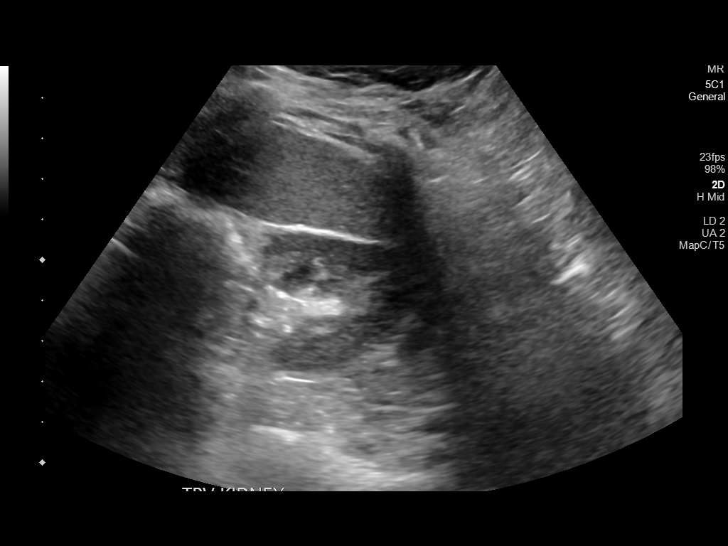
[im 32/32]
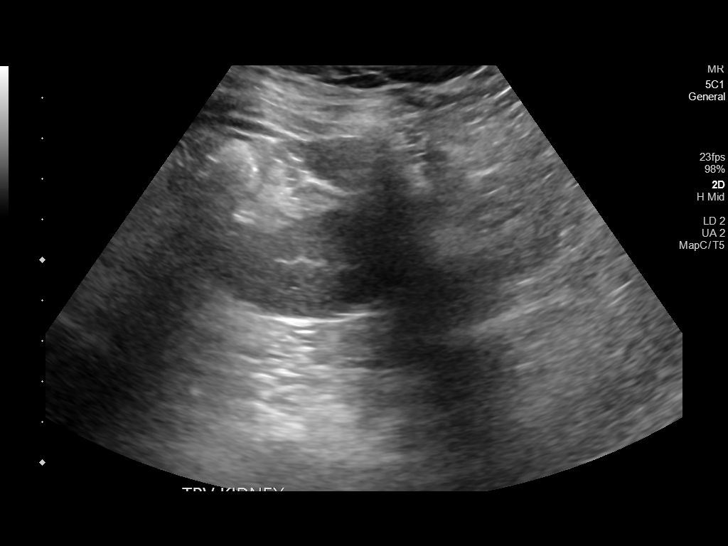

[14 of 25 positions shown; findings below may reference images not displayed]

FINDINGS: Right Kidney:

Renal measurements: 11.1 x 4.6 x 4.9 cm = volume: 131 mL.
Echogenicity within normal limits. No mass visualized. Mild
hydronephrosis.

Left Kidney:

Renal measurements: 10.4 x 3.7 x 3.9 cm = volume: 79 mL.
Echogenicity within normal limits. No mass or hydronephrosis
visualized.

Bladder:

Appears normal for degree of bladder distention.

Other:

None.
IMPRESSION: Mild right hydronephrosis.

No parenchymal calculi.

## 2022-10-12 LAB — OB RESULTS CONSOLE ABO/RH: RH Type: POSITIVE

## 2022-10-12 LAB — OB RESULTS CONSOLE ANTIBODY SCREEN: Antibody Screen: NEGATIVE

## 2022-12-06 LAB — OB RESULTS CONSOLE HIV ANTIBODY (ROUTINE TESTING): HIV: NONREACTIVE

## 2022-12-06 LAB — OB RESULTS CONSOLE RUBELLA ANTIBODY, IGM: Rubella: IMMUNE

## 2022-12-06 LAB — OB RESULTS CONSOLE HEPATITIS B SURFACE ANTIGEN: Hepatitis B Surface Ag: NEGATIVE

## 2022-12-06 LAB — OB RESULTS CONSOLE GC/CHLAMYDIA
Chlamydia: NEGATIVE
Neisseria Gonorrhea: NEGATIVE

## 2022-12-06 LAB — OB RESULTS CONSOLE RPR: RPR: NONREACTIVE

## 2023-05-16 ENCOUNTER — Other Ambulatory Visit: Payer: Self-pay | Admitting: Obstetrics

## 2023-05-24 LAB — OB RESULTS CONSOLE GBS: GBS: NEGATIVE

## 2023-05-31 ENCOUNTER — Encounter (HOSPITAL_COMMUNITY): Payer: Self-pay

## 2023-05-31 NOTE — Patient Instructions (Addendum)
Barbara Pham  05/31/2023   Your procedure is scheduled on:  06/14/2023  Arrive at 1115 at Entrance C on [address] at [facility]  and [employer]. You are invited to use the FREE valet parking or use the Visitor's parking deck.  Pick up the phone at the desk and dial [phone].  Call this number if you have problems the morning of surgery: [phone]  Remember:   Do not eat food:(After Midnight) Desps de medianoche.  Do not drink clear liquids: (4 Hours before arrival) 4 horas ante llegada.  Take these medicines the morning of surgery with A SIP OF WATER:  wellbutrin   Do not wear jewelry, make-up or nail polish.  Do not wear lotions, powders, or perfumes. Do not wear deodorant.  Do not shave 48 hours prior to surgery.  Do not bring valuables to the hospital.  [facility] is not   responsible for any belongings or valuables brought to the hospital.  Contacts, dentures or bridgework may not be worn into surgery.  Leave suitcase in the car. After surgery it may be brought to your room.  For patients admitted to the hospital, checkout time is 11:00 AM the day of              discharge.      Please read over the following fact sheets that you were given:     Preparing for Surgery

## 2023-06-11 ENCOUNTER — Inpatient Hospital Stay (HOSPITAL_COMMUNITY)
Admission: AD | Admit: 2023-06-11 | Discharge: 2023-06-11 | Disposition: A | Discharge status: Home / Self Care | Payer: BC Managed Care – PPO | Source: Home / Self Care | Attending: Obstetrics and Gynecology | Admitting: Obstetrics and Gynecology

## 2023-06-11 ENCOUNTER — Encounter (HOSPITAL_COMMUNITY): Payer: Self-pay | Admitting: Obstetrics and Gynecology

## 2023-06-11 DIAGNOSIS — O26893 Other specified pregnancy related conditions, third trimester: Secondary | ICD-10-CM | POA: Insufficient documentation

## 2023-06-11 DIAGNOSIS — Z8759 Personal history of other complications of pregnancy, childbirth and the puerperium: Secondary | ICD-10-CM | POA: Insufficient documentation

## 2023-06-11 DIAGNOSIS — Z3A38 38 weeks gestation of pregnancy: Secondary | ICD-10-CM | POA: Insufficient documentation

## 2023-06-11 DIAGNOSIS — O34219 Maternal care for unspecified type scar from previous cesarean delivery: Secondary | ICD-10-CM | POA: Insufficient documentation

## 2023-06-11 DIAGNOSIS — Z3490 Encounter for supervision of normal pregnancy, unspecified, unspecified trimester: Secondary | ICD-10-CM

## 2023-06-11 LAB — POCT FERN TEST: POCT Fern Test: NEGATIVE

## 2023-06-11 LAB — RUPTURE OF MEMBRANE (ROM)PLUS: Rom Plus: NEGATIVE

## 2023-06-11 NOTE — MAU Note (Signed)
.  Barbara Pham is a 32 y.o. at [redacted]w[redacted]d here in MAU reporting: gush of clear watery fluid around 0900 that soaked her underwear. Denies VB. +FM. Endorses lower abdominal cramping.   Pain score: 3 Vitals:   06/11/23 1138  BP: 118/80  Pulse: 88  Temp: 98.4 F (36.9 C)  SpO2: 99%

## 2023-06-11 NOTE — MAU Provider Note (Signed)
Barbara Pham is a 32 y.o. G2P0101 at [redacted]w[redacted]d here in MAU reporting: gush of clear watery fluid around 0900 that soaked her underwear. Denies VB. +FM. Endorses lower abdominal cramping. Good fetal movement, No vaginal bleeding, no further leaking fluid since initial episode.  Prenatal Course Source of Care:[facility]  with onset of care at 10weeks Pregnancy complications or risk: Hx PEC with C/S 31 wks.  Current medications:  Baby ASA, PNV, Prilosec, Wellbutrin, BuSpar  OB History     Gravida  2   Para  1   Term      Preterm  1   AB      Living  1      SAB      IAB      Ectopic      Multiple  0   Live Births  1          Past Medical History:  Diagnosis Date   Acid reflux    Cat allergies    History of pre-eclampsia in prior pregnancy, currently pregnant    Kidney stones    Multiple allergies    dust and pollen   Pollen allergies    Postpartum care following cesarean delivery (8/1) 06/29/2016   Past Surgical History:  Procedure Laterality Date   CESAREAN SECTION N/A 06/28/2016   Procedure: CESAREAN SECTION;  Surgeon: Noland Fordyce, MD;  Location: [facility] BIRTHING SUITES;  Service: Obstetrics;  Laterality: N/A;   feet surgery     had to stretch bones due to growth plates closing too soon   TONSILLECTOMY AND ADENOIDECTOMY     Family History: family history is not on file. Social History:  reports that she has never smoked. She has never used smokeless tobacco. She reports current alcohol use. She reports that she does not use drugs.  Review of Systems - Negative except vaginal discharge earlier today     Blood pressure 113/75, pulse 86, temperature 98.4 F (36.9 C), temperature source Oral, weight 67.3 kg, SpO2 99%, unknown if currently breastfeeding.  Physical Exam: Blood pressure 113/75, pulse 86, temperature 98.4 F (36.9 C), temperature source Oral, weight 67.3 kg, SpO2 99%, unknown if currently breastfeeding. General:  NAD Heart: RRR, no murmurs Lungs: CTA b/l  Abd: Soft, NT, EFW  Ext: no edema Neuro: DTRs normal    Membranes:intact Vaginal bleeding:none    FHR:  Baseline rate 130   Variability moderate  Accelerations +  Decelerations no Contractions: Frequency 6-8  Duration 40-70  Intensity mild  Pertinent Labs/Studies:   Results for orders placed or performed during the hospital encounter of 06/11/23 (from the past 24 hour(s))  Rupture of Membrane (ROM) Plus     Status: None   Collection Time: 06/11/23 11:54 AM  Result Value Ref Range   Rom Plus NEGATIVE   Fern Test     Status: None   Collection Time: 06/11/23 12:03 PM  Result Value Ref Range   POCT Fern Test Negative = intact amniotic membranes      Prenatal labs: ABO, Rh: O/Positive/-- (11/15 0000) Antibody: Negative (11/15 0000) Rubella: Immune (01/09 0000) RPR: Nonreactive (01/09 0000)  HBsAg: Negative (01/09 0000)  HIV: Non-reactive (01/09 0000)  GBS: Negative/-- (06/26 0000)  1 hr Glucola wnl  Genetic screening LR XY Ultrasound: CUS normal anatomy, XY, ant plac, AGA  Assessment/Plan: 32 y.o. G2P0101 at [redacted]w[redacted]d Hx C/S for severe PEC at 31 wks FHT category 1  Patient with reported LOF this AM  Negative ferning and amnisure test, no vaginal pooling, no further loss of fluid since this AM  Planned R C/S on 06/14/2023 Advised patient monitor for any further gushes PV, nothing PV until surgery.  Keep appointment as scheduled for Tuesday lab visit. [facility] daily.     Consultant: Dr. Maryann Conners, CNM, MSN 06/11/2023, 1:08 PM

## 2023-06-12 NOTE — Anesthesia Preprocedure Evaluation (Signed)
Anesthesia Evaluation  Patient identified by MRN, date of birth, ID band Patient awake    Reviewed: Allergy & Precautions, NPO status , Patient's Chart, lab work & pertinent test results  History of Anesthesia Complications Negative for: history of anesthetic complications  Airway Mallampati: I  TM Distance: >3 FB Neck ROM: Full    Dental   Pulmonary neg pulmonary ROS   Pulmonary exam normal breath sounds clear to auscultation       Cardiovascular negative cardio ROS  Rhythm:Regular Rate:Normal     Neuro/Psych negative neurological ROS     GI/Hepatic Neg liver ROS,GERD  Medicated,,  Endo/Other  negative endocrine ROS    Renal/GU negative Renal ROS     Musculoskeletal   Abdominal   Peds  Hematology negative hematology ROS (+) Lab Results      Component                Value               Date                      WBC                      7.9                 06/13/2023                HGB                      10.8 (L)            06/13/2023                HCT                      33.7 (L)            06/13/2023                MCV                      94.7                06/13/2023                PLT                      183                 06/13/2023              Anesthesia Other Findings H/o c-section x1 for abnormal dopplers. Had pre-eclampsia with first pregnancy but not this one.  Takes baby aspirin.  Reproductive/Obstetrics (+) Pregnancy                              Anesthesia Physical Anesthesia Plan  ASA: 2  Anesthesia Plan: Spinal   Post-op Pain Management:    Induction:   PONV Risk Score and Plan: 2 and Ondansetron, Dexamethasone and Treatment may vary due to age or medical condition  Airway Management Planned: Natural Airway  Additional Equipment:   Intra-op Plan:   Post-operative Plan:   Informed Consent: I have reviewed the patients History and Physical, chart,  labs and discussed the procedure including the risks, benefits and alternatives for the  proposed anesthesia with the patient or authorized representative who has indicated his/her understanding and acceptance.       Plan Discussed with: Anesthesiologist, CRNA and Surgeon  Anesthesia Plan Comments: (I have discussed risks of neuraxial anesthesia including but not limited to infection, bleeding, nerve injury, back pain, headache, seizures, and failure of block. Patient denies bleeding disorders and is not currently anticoagulated. Labs have been reviewed. Risks and benefits discussed. All patient's questions answered.  )         Anesthesia Quick Evaluation

## 2023-06-13 ENCOUNTER — Encounter (HOSPITAL_COMMUNITY): Admission: RE | Admit: 2023-06-13 | Payer: BC Managed Care – PPO | Source: Ambulatory Visit

## 2023-06-13 DIAGNOSIS — Z01812 Encounter for preprocedural laboratory examination: Secondary | ICD-10-CM | POA: Insufficient documentation

## 2023-06-13 HISTORY — DX: Supervision of pregnancy with other poor reproductive or obstetric history, unspecified trimester: O09.299

## 2023-06-13 LAB — CBC
HCT: 33.7 % — ABNORMAL LOW (ref 36.0–46.0)
Hemoglobin: 10.8 g/dL — ABNORMAL LOW (ref 12.0–15.0)
MCH: 30.3 pg (ref 26.0–34.0)
MCHC: 32 g/dL (ref 30.0–36.0)
MCV: 94.7 fL (ref 80.0–100.0)
Platelets: 183 10*3/uL (ref 150–400)
RBC: 3.56 MIL/uL — ABNORMAL LOW (ref 3.87–5.11)
RDW: 13.5 % (ref 11.5–15.5)
WBC: 7.9 10*3/uL (ref 4.0–10.5)
nRBC: 0 % (ref 0.0–0.2)

## 2023-06-13 LAB — TYPE AND SCREEN
ABO/RH(D): O POS
Antibody Screen: NEGATIVE

## 2023-06-13 LAB — RPR: RPR Ser Ql: NONREACTIVE

## 2023-06-13 NOTE — H&P (Signed)
Barbara Pham is a 32 y.o. G2P0101 at [redacted]w[redacted]d presenting for RCS. Pt notes rare contractions . Good fetal movement, No vaginal bleeding, not leaking fluid.  P[facility]are at [facility] since 6 wks - Dated by LMP c/w 6 wk u/s - h/o anxiety through preg due to h/o PTD in past, managed well through preg til 36 wks when daughter had seizure, started on Wellbutrin and Buspar - h/o severe PEC with PCS at 31 wks for abnl dopplers. Daughter with CP.  Pt on baby ASA this preg and all evals for PEC have been neg - GERD, on Pepcid - fetal growth 36 wks, 6'8/ 61%   Prenatal Transfer Tool  Maternal Diabetes: No Genetic Screening: Normal Maternal Ultrasounds/Referrals: Normal Fetal Ultrasounds or other Referrals:  None Maternal Substance Abuse:  No Significant Maternal Medications:  None Significant Maternal Lab Results: Group B Strep negative     OB History     Gravida  2   Para  1   Term      Preterm  1   AB      Living  1      SAB      IAB      Ectopic      Multiple  0   Live Births  1          Past Medical History:  Diagnosis Date   Acid reflux    Cat allergies    History of pre-eclampsia in prior pregnancy, currently pregnant    Kidney stones    Multiple allergies    dust and pollen   Pollen allergies    Postpartum care following cesarean delivery (8/1) 06/29/2016   Past Surgical History:  Procedure Laterality Date   CESAREAN SECTION N/A 06/28/2016   Procedure: CESAREAN SECTION;  Surgeon: Noland Fordyce, MD;  Location: [facility] BIRTHING SUITES;  Service: Obstetrics;  Laterality: N/A;   feet surgery     had to stretch bones due to growth plates closing too soon   TONSILLECTOMY AND ADENOIDECTOMY     Family History: family history is not on file. Social History:  reports that she has never smoked. She has never used smokeless tobacco. She reports current alcohol use. She reports that she does not use drugs.  Review of Systems - Negative except anxiety   Physical  Exam:  Today's Vitals   06/14/23 1135  BP: 115/84  Pulse: 80  Resp: 18  Temp: 98.1 F (36.7 C)  TempSrc: Oral  SpO2: 99%  Weight: 68 kg  Height: 5' (1.524 m)   Body mass index is 29.29 kg/m.  Gen: well appearing, no distress  Back: no CVAT Abd: gravid, NT, no RUQ pain LE: no edema, equal bilaterally, non-tender  Prenatal labs: ABO, Rh: --/--/O POS (07/16 0933) Antibody: NEG (07/16 0933) Rubella: Immune (01/09 0000) RPR: NON REACTIVE (07/16 0934)  HBsAg: Negative (01/09 0000)  HIV: Non-reactive (01/09 0000)  GBS: Negative/-- (06/26 0000)  1 hr Glucola 120  Genetic screening normal Panorama, nl Horizon Anatomy US normal  CBC    Component Value Date/Time   WBC 7.9 06/13/2023 0934   RBC 3.56 (L) 06/13/2023 0934   HGB 10.8 (L) 06/13/2023 0934   HCT 33.7 (L) 06/13/2023 0934   PLT 183 06/13/2023 0934   MCV 94.7 06/13/2023 0934   MCH 30.3 06/13/2023 0934   MCHC 32.0 06/13/2023 0934   RDW 13.5 06/13/2023 0934   LYMPHSABS 3.6 10/08/2014 1445   MONOABS 0.5 10/08/2014 1445   EOSABS  0.1 10/08/2014 1445   BASOSABS 0.1 10/08/2014 1445     Assessment/Plan: 31 y.o. G2P0101 at [redacted]w[redacted]d - RCS. R/B reviewed with pt - watch for PPD, cont wellbutrin and buspar   Lendon Colonel 06/13/2023, 10:40 PM  Lendon Colonel 06/14/2023 1:33 PM

## 2023-06-14 ENCOUNTER — Inpatient Hospital Stay (HOSPITAL_COMMUNITY): Payer: BC Managed Care – PPO | Admitting: Anesthesiology

## 2023-06-14 ENCOUNTER — Other Ambulatory Visit: Payer: Self-pay

## 2023-06-14 ENCOUNTER — Encounter (HOSPITAL_COMMUNITY): Payer: Self-pay | Admitting: Obstetrics

## 2023-06-14 ENCOUNTER — Encounter (HOSPITAL_COMMUNITY)
Admission: RE | Disposition: A | Discharge status: Home / Self Care | Payer: Self-pay | Source: Home / Self Care | Attending: Obstetrics

## 2023-06-14 ENCOUNTER — Inpatient Hospital Stay (HOSPITAL_COMMUNITY)
Admission: RE | Admit: 2023-06-14 | Discharge: 2023-06-16 | DRG: 787 | Disposition: A | Discharge status: Home / Self Care | Payer: BC Managed Care – PPO | Attending: Obstetrics | Admitting: Obstetrics

## 2023-06-14 DIAGNOSIS — Z98891 History of uterine scar from previous surgery: Secondary | ICD-10-CM

## 2023-06-14 DIAGNOSIS — O9081 Anemia of the puerperium: Secondary | ICD-10-CM | POA: Diagnosis not present

## 2023-06-14 DIAGNOSIS — O34211 Maternal care for low transverse scar from previous cesarean delivery: Secondary | ICD-10-CM | POA: Diagnosis present

## 2023-06-14 DIAGNOSIS — D62 Acute posthemorrhagic anemia: Secondary | ICD-10-CM | POA: Diagnosis not present

## 2023-06-14 DIAGNOSIS — Z3A39 39 weeks gestation of pregnancy: Secondary | ICD-10-CM

## 2023-06-14 DIAGNOSIS — O9902 Anemia complicating childbirth: Secondary | ICD-10-CM | POA: Diagnosis not present

## 2023-06-14 SURGERY — Surgical Case
Anesthesia: Spinal | Site: Abdomen

## 2023-06-14 MED ORDER — MENTHOL 3 MG MT LOZG: 1.0000 | LOZENGE | OROMUCOSAL | Status: DC | PRN

## 2023-06-14 MED ORDER — SOD CITRATE-CITRIC ACID 500-334 MG/5ML PO SOLN
ORAL | Status: AC
  Filled 2023-06-14: qty 30

## 2023-06-14 MED ORDER — PHENYLEPHRINE 80 MCG/ML (10ML) SYRINGE FOR IV PUSH (FOR BLOOD PRESSURE SUPPORT)
PREFILLED_SYRINGE | INTRAVENOUS | Status: AC
  Filled 2023-06-14: qty 10

## 2023-06-14 MED ORDER — PHENYLEPHRINE HCL-NACL 20-0.9 MG/250ML-% IV SOLN
INTRAVENOUS | Status: DC | PRN
  Administered 2023-06-14: 60 ug/min via INTRAVENOUS

## 2023-06-14 MED ORDER — FENTANYL CITRATE (PF) 100 MCG/2ML IJ SOLN
INTRAMUSCULAR | Status: DC | PRN
  Administered 2023-06-14: 15 ug via INTRATHECAL

## 2023-06-14 MED ORDER — DIPHENHYDRAMINE HCL 50 MG/ML IJ SOLN
INTRAMUSCULAR | Status: AC
  Filled 2023-06-14: qty 1

## 2023-06-14 MED ORDER — DEXAMETHASONE SODIUM PHOSPHATE 10 MG/ML IJ SOLN
INTRAMUSCULAR | Status: DC | PRN
  Administered 2023-06-14: 10 mg via INTRAVENOUS

## 2023-06-14 MED ORDER — ACETAMINOPHEN 500 MG PO TABS
1000.0000 mg | ORAL_TABLET | Freq: Four times a day (QID) | ORAL | Status: DC
  Administered 2023-06-14 – 2023-06-16 (×6): 1000 mg via ORAL
  Filled 2023-06-14 (×6): qty 2

## 2023-06-14 MED ORDER — BUSPIRONE HCL 5 MG PO TABS
7.5000 mg | ORAL_TABLET | Freq: Two times a day (BID) | ORAL | Status: DC
  Administered 2023-06-14 – 2023-06-16 (×4): 7.5 mg via ORAL
  Filled 2023-06-14 (×4): qty 2

## 2023-06-14 MED ORDER — ZOLPIDEM TARTRATE 5 MG PO TABS: 5.0000 mg | ORAL_TABLET | Freq: Every evening | ORAL | Status: DC | PRN

## 2023-06-14 MED ORDER — ONDANSETRON HCL 4 MG/2ML IJ SOLN
INTRAMUSCULAR | Status: AC
  Filled 2023-06-14: qty 2

## 2023-06-14 MED ORDER — FENTANYL CITRATE (PF) 100 MCG/2ML IJ SOLN
INTRAMUSCULAR | Status: AC
  Filled 2023-06-14: qty 2

## 2023-06-14 MED ORDER — ALBUMIN HUMAN 5 % IV SOLN
INTRAVENOUS | Status: AC
  Filled 2023-06-14: qty 250

## 2023-06-14 MED ORDER — DEXMEDETOMIDINE HCL IN NACL 80 MCG/20ML IV SOLN
INTRAVENOUS | Status: DC | PRN
  Administered 2023-06-14: 8 ug via INTRAVENOUS

## 2023-06-14 MED ORDER — CEFAZOLIN SODIUM-DEXTROSE 2-4 GM/100ML-% IV SOLN
INTRAVENOUS | Status: AC
  Filled 2023-06-14: qty 100

## 2023-06-14 MED ORDER — SENNOSIDES-DOCUSATE SODIUM 8.6-50 MG PO TABS
2.0000 | ORAL_TABLET | ORAL | Status: DC
  Administered 2023-06-15: 2 via ORAL
  Filled 2023-06-14: qty 2

## 2023-06-14 MED ORDER — ACETAMINOPHEN 10 MG/ML IV SOLN
INTRAVENOUS | Status: DC | PRN
  Administered 2023-06-14: 1000 mg via INTRAVENOUS

## 2023-06-14 MED ORDER — WITCH HAZEL-GLYCERIN EX PADS: 1.0000 | MEDICATED_PAD | CUTANEOUS | Status: DC | PRN

## 2023-06-14 MED ORDER — SODIUM CHLORIDE 0.9 % IR SOLN
Status: DC | PRN
  Administered 2023-06-14: 1000 mL

## 2023-06-14 MED ORDER — OXYTOCIN-SODIUM CHLORIDE 30-0.9 UT/500ML-% IV SOLN: 2.5000 [IU]/h | INTRAVENOUS | Status: AC

## 2023-06-14 MED ORDER — BUPIVACAINE HCL (PF) 0.25 % IJ SOLN
INTRAMUSCULAR | Status: AC
  Filled 2023-06-14: qty 20

## 2023-06-14 MED ORDER — PHENYLEPHRINE HCL-NACL 20-0.9 MG/250ML-% IV SOLN
INTRAVENOUS | Status: AC
  Filled 2023-06-14: qty 750

## 2023-06-14 MED ORDER — KETOROLAC TROMETHAMINE 30 MG/ML IJ SOLN
30.0000 mg | Freq: Once | INTRAMUSCULAR | Status: AC | PRN
  Administered 2023-06-14: 30 mg via INTRAVENOUS

## 2023-06-14 MED ORDER — PROMETHAZINE HCL 25 MG/ML IJ SOLN: 6.2500 mg | INTRAMUSCULAR | Status: DC | PRN

## 2023-06-14 MED ORDER — FAMOTIDINE 20 MG PO TABS
20.0000 mg | ORAL_TABLET | Freq: Every evening | ORAL | Status: DC
  Administered 2023-06-15: 20 mg via ORAL
  Filled 2023-06-14: qty 1

## 2023-06-14 MED ORDER — MORPHINE SULFATE (PF) 0.5 MG/ML IJ SOLN
INTRAMUSCULAR | Status: AC
  Filled 2023-06-14: qty 10

## 2023-06-14 MED ORDER — OXYCODONE HCL 5 MG PO TABS: 5.0000 mg | ORAL_TABLET | Freq: Once | ORAL | Status: DC | PRN

## 2023-06-14 MED ORDER — OXYCODONE HCL 5 MG PO TABS
5.0000 mg | ORAL_TABLET | ORAL | Status: DC | PRN
  Administered 2023-06-15 – 2023-06-16 (×7): 5 mg via ORAL
  Filled 2023-06-14 (×7): qty 1

## 2023-06-14 MED ORDER — BUPROPION HCL ER (SR) 150 MG PO TB12
150.0000 mg | ORAL_TABLET | Freq: Two times a day (BID) | ORAL | Status: DC
  Administered 2023-06-14 – 2023-06-16 (×4): 150 mg via ORAL
  Filled 2023-06-14 (×5): qty 1

## 2023-06-14 MED ORDER — OXYCODONE HCL 5 MG/5ML PO SOLN: 5.0000 mg | Freq: Once | ORAL | Status: DC | PRN

## 2023-06-14 MED ORDER — BUPIVACAINE IN DEXTROSE 0.75-8.25 % IT SOLN
INTRATHECAL | Status: DC | PRN
  Administered 2023-06-14: 1.6 mL via INTRATHECAL

## 2023-06-14 MED ORDER — ONDANSETRON HCL 4 MG/2ML IJ SOLN
INTRAMUSCULAR | Status: DC | PRN
  Administered 2023-06-14: 4 mg via INTRAVENOUS

## 2023-06-14 MED ORDER — ACETAMINOPHEN 10 MG/ML IV SOLN: 1000.0000 mg | Freq: Once | INTRAVENOUS | Status: DC | PRN

## 2023-06-14 MED ORDER — FAMOTIDINE IN NACL 20-0.9 MG/50ML-% IV SOLN
20.0000 mg | Freq: Once | INTRAVENOUS | Status: AC
  Administered 2023-06-14: 20 mg via INTRAVENOUS
  Filled 2023-06-14: qty 50

## 2023-06-14 MED ORDER — FENTANYL CITRATE (PF) 100 MCG/2ML IJ SOLN
25.0000 ug | INTRAMUSCULAR | Status: DC | PRN
  Administered 2023-06-14: 25 ug via INTRAVENOUS
  Administered 2023-06-14: 50 ug via INTRAVENOUS

## 2023-06-14 MED ORDER — LACTATED RINGERS IV SOLN: INTRAVENOUS | Status: DC

## 2023-06-14 MED ORDER — DIPHENHYDRAMINE HCL 25 MG PO CAPS: 25.0000 mg | ORAL_CAPSULE | Freq: Four times a day (QID) | ORAL | Status: DC | PRN

## 2023-06-14 MED ORDER — IBUPROFEN 600 MG PO TABS
600.0000 mg | ORAL_TABLET | Freq: Four times a day (QID) | ORAL | Status: DC
  Administered 2023-06-15 – 2023-06-16 (×3): 600 mg via ORAL
  Filled 2023-06-14 (×3): qty 1

## 2023-06-14 MED ORDER — STERILE WATER FOR IRRIGATION IR SOLN
Status: DC | PRN
  Administered 2023-06-14: 1000 mL

## 2023-06-14 MED ORDER — CEFAZOLIN SODIUM-DEXTROSE 2-4 GM/100ML-% IV SOLN
2.0000 g | INTRAVENOUS | Status: AC
  Administered 2023-06-14: 2 g via INTRAVENOUS

## 2023-06-14 MED ORDER — TETANUS-DIPHTH-ACELL PERTUSSIS 5-2.5-18.5 LF-MCG/0.5 IM SUSY: 0.5000 mL | PREFILLED_SYRINGE | Freq: Once | INTRAMUSCULAR | Status: DC

## 2023-06-14 MED ORDER — POVIDONE-IODINE 10 % EX SWAB
2.0000 | Freq: Once | CUTANEOUS | Status: AC
  Administered 2023-06-14: 2 via TOPICAL

## 2023-06-14 MED ORDER — PRENATAL MULTIVITAMIN CH
1.0000 | ORAL_TABLET | Freq: Every day | ORAL | Status: DC
  Administered 2023-06-15 – 2023-06-16 (×2): 1 via ORAL
  Filled 2023-06-14 (×2): qty 1

## 2023-06-14 MED ORDER — SIMETHICONE 80 MG PO CHEW
80.0000 mg | CHEWABLE_TABLET | Freq: Three times a day (TID) | ORAL | Status: DC
  Administered 2023-06-15 – 2023-06-16 (×4): 80 mg via ORAL
  Filled 2023-06-14 (×6): qty 1

## 2023-06-14 MED ORDER — SIMETHICONE 80 MG PO CHEW
80.0000 mg | CHEWABLE_TABLET | ORAL | Status: DC | PRN
  Administered 2023-06-15: 80 mg via ORAL

## 2023-06-14 MED ORDER — SOD CITRATE-CITRIC ACID 500-334 MG/5ML PO SOLN
30.0000 mL | Freq: Once | ORAL | Status: AC
  Administered 2023-06-14: 30 mL via ORAL

## 2023-06-14 MED ORDER — OXYTOCIN-SODIUM CHLORIDE 30-0.9 UT/500ML-% IV SOLN
INTRAVENOUS | Status: DC | PRN
  Administered 2023-06-14: 400 mL via INTRAVENOUS

## 2023-06-14 MED ORDER — OXYTOCIN-SODIUM CHLORIDE 30-0.9 UT/500ML-% IV SOLN
INTRAVENOUS | Status: AC
  Filled 2023-06-14: qty 500

## 2023-06-14 MED ORDER — DIBUCAINE (PERIANAL) 1 % EX OINT: 1.0000 | TOPICAL_OINTMENT | CUTANEOUS | Status: DC | PRN

## 2023-06-14 MED ORDER — MORPHINE SULFATE (PF) 0.5 MG/ML IJ SOLN
INTRAMUSCULAR | Status: DC | PRN
  Administered 2023-06-14: 150 ug via INTRATHECAL

## 2023-06-14 MED ORDER — KETOROLAC TROMETHAMINE 30 MG/ML IJ SOLN
INTRAMUSCULAR | Status: AC
  Filled 2023-06-14: qty 1

## 2023-06-14 MED ORDER — KETOROLAC TROMETHAMINE 30 MG/ML IJ SOLN
30.0000 mg | Freq: Four times a day (QID) | INTRAMUSCULAR | Status: AC
  Administered 2023-06-14 – 2023-06-15 (×4): 30 mg via INTRAVENOUS
  Filled 2023-06-14 (×4): qty 1

## 2023-06-14 MED ORDER — DEXAMETHASONE SODIUM PHOSPHATE 10 MG/ML IJ SOLN
INTRAMUSCULAR | Status: AC
  Filled 2023-06-14: qty 1

## 2023-06-14 MED ORDER — COCONUT OIL OIL: 1.0000 | TOPICAL_OIL | Status: DC | PRN

## 2023-06-14 SURGICAL SUPPLY — 34 items
APL PRP STRL LF DISP 70% ISPRP (MISCELLANEOUS) ×2
APL SKNCLS STERI-STRIP NONHPOA (GAUZE/BANDAGES/DRESSINGS) ×1
BENZOIN TINCTURE PRP APPL 2/3 (GAUZE/BANDAGES/DRESSINGS) IMPLANT
CHLORAPREP W/TINT 26 (MISCELLANEOUS) ×2 IMPLANT
CLAMP UMBILICAL CORD (MISCELLANEOUS) ×1 IMPLANT
CLOTH BEACON ORANGE TIMEOUT ST (SAFETY) ×1 IMPLANT
DRSG OPSITE POSTOP 4X10 (GAUZE/BANDAGES/DRESSINGS) ×1 IMPLANT
ELECT REM PT RETURN 9FT ADLT (ELECTROSURGICAL) ×1
ELECTRODE REM PT RTRN 9FT ADLT (ELECTROSURGICAL) ×1 IMPLANT
EXTRACTOR VACUUM M CUP 4 TUBE (SUCTIONS) IMPLANT
GAUZE SPONGE 4X4 12PLY STRL LF (GAUZE/BANDAGES/DRESSINGS) IMPLANT
GLOVE BIO SURGEON STRL SZ 6.5 (GLOVE) ×1 IMPLANT
GLOVE BIOGEL PI IND STRL 7.0 (GLOVE) ×3 IMPLANT
GOWN STRL REUS W/TWL LRG LVL3 (GOWN DISPOSABLE) ×2 IMPLANT
KIT ABG SYR 3ML LUER SLIP (SYRINGE) IMPLANT
NDL HYPO 25X5/8 SAFETYGLIDE (NEEDLE) IMPLANT
NEEDLE HYPO 22GX1.5 SAFETY (NEEDLE) IMPLANT
NEEDLE HYPO 25X5/8 SAFETYGLIDE (NEEDLE) IMPLANT
NS IRRIG 1000ML POUR BTL (IV SOLUTION) ×1 IMPLANT
PACK C SECTION WH (CUSTOM PROCEDURE TRAY) ×1 IMPLANT
PAD OB MATERNITY 4.3X12.25 (PERSONAL CARE ITEMS) ×1 IMPLANT
STRIP CLOSURE SKIN 1/2X4 (GAUZE/BANDAGES/DRESSINGS) IMPLANT
SUT MON AB 4-0 PS1 27 (SUTURE) ×1 IMPLANT
SUT PLAIN 0 NONE (SUTURE) IMPLANT
SUT PLAIN 2 0 XLH (SUTURE) IMPLANT
SUT VIC AB 0 CT1 36 (SUTURE) ×2 IMPLANT
SUT VIC AB 0 CTX 36 (SUTURE) ×3
SUT VIC AB 0 CTX36XBRD ANBCTRL (SUTURE) ×2 IMPLANT
SUT VIC AB 2-0 CT1 27 (SUTURE) ×1
SUT VIC AB 2-0 CT1 TAPERPNT 27 (SUTURE) ×1 IMPLANT
SYR CONTROL 10ML LL (SYRINGE) IMPLANT
TOWEL OR 17X24 6PK STRL BLUE (TOWEL DISPOSABLE) ×1 IMPLANT
TRAY FOLEY W/BAG SLVR 14FR LF (SET/KITS/TRAYS/PACK) IMPLANT
WATER STERILE IRR 1000ML POUR (IV SOLUTION) ×1 IMPLANT

## 2023-06-14 NOTE — Brief Op Note (Signed)
06/14/2023  3:40 PM  PATIENT:  [name]  32 y.o. female  PRE-OPERATIVE DIAGNOSIS:  Previous Cesarean Section, desires repeat  POST-OPERATIVE DIAGNOSIS:  Previous Cesarean Section, desires repeat  PROCEDURE:  Procedure(s) with comments: Repeat CESAREAN SECTION (N/A) - EDD 06/20/23 Low-transverse cesarean section with 2 layer closure  SURGEON:  Surgeons and Role:    * Noland Fordyce, MD - Primary  PHYSICIAN ASSISTANT:   ASSISTANTS: Arlan Organ, CNM  ANESTHESIA:   spinal  EBL:  100 mL   BLOOD ADMINISTERED:none  DRAINS: Urinary Catheter (Foley)   LOCAL MEDICATIONS USED:  NONE  SPECIMEN:  Source of Specimen:  Placenta to labor and delivery for disposal  DISPOSITION OF SPECIMEN:  N/A  COUNTS:  YES  TOURNIQUET:  * No tourniquets in log *  DICTATION: .Note written in EPIC  PLAN OF CARE: Admit to inpatient   PATIENT DISPOSITION:  PACU - hemodynamically stable.   Delay start of Pharmacological VTE agent (>24hrs) due to surgical blood loss or risk of bleeding: yes

## 2023-06-14 NOTE — Anesthesia Procedure Notes (Signed)
Spinal  Patient location during procedure: OR Start time: 06/14/2023 1:44 PM End time: 06/14/2023 1:46 PM Reason for block: surgical anesthesia Staffing Performed: anesthesiologist  Anesthesiologist: Linton Rump, MD Performed by: Linton Rump, MD Authorized by: Linton Rump, MD   Preanesthetic Checklist Completed: patient identified, IV checked, site marked, risks and benefits discussed, surgical consent, monitors and equipment checked, pre-op evaluation and timeout performed Spinal Block Patient position: sitting Prep: DuraPrep Patient monitoring: blood pressure and continuous pulse ox Approach: midline Location: L3-4 Injection technique: single-shot Needle Needle type: Pencan  Needle gauge: 24 G Needle length: 9 cm Assessment Sensory level: T4 Additional Notes Risks and benefits of neuraxial anesthesia including, but not limited to, infection, bleeding, local anesthetic toxicity, headache, hypotension, back pain, block failure, etc. were discussed with the patient. The patient expressed understanding and consented to the procedure. I confirmed that the patient has no bleeding disorders and is not taking blood thinners. I confirmed the patient's last platelet count with the nurse. Monitors were applied. A time-out was performed immediately prior to the procedure. Sterile technique was used throughout the whole procedure.   1 attempt(s)

## 2023-06-14 NOTE — Anesthesia Postprocedure Evaluation (Signed)
Anesthesia Post Note  Patient: Barbara Pham  Procedure(s) Performed: Repeat CESAREAN SECTION (Abdomen)     Patient location during evaluation: PACU Anesthesia Type: Spinal Level of consciousness: awake Pain management: pain level controlled Vital Signs Assessment: post-procedure vital signs reviewed and stable Respiratory status: spontaneous breathing, respiratory function stable and nonlabored ventilation Cardiovascular status: blood pressure returned to baseline and stable Postop Assessment: no headache, no backache and no apparent nausea or vomiting Anesthetic complications: no   No notable events documented.  Last Vitals:  Vitals:   06/14/23 1630 06/14/23 1637  BP: 112/79 99/63  Pulse: 80 73  Resp: (!) 23 16  Temp:  36.7 C  SpO2: 97% 99%    Last Pain:  Vitals:   06/14/23 1637  TempSrc: Oral  PainSc: 0-No pain   Pain Goal: Patients Stated Pain Goal: 5 (06/14/23 1135)              Epidural/Spinal Function Cutaneous sensation: Tingles (06/14/23 1637)  Linton Rump

## 2023-06-14 NOTE — Transfer of Care (Signed)
Immediate Anesthesia Transfer of Care Note  Patient: Barbara Pham  Procedure(s) Performed: Repeat CESAREAN SECTION (Abdomen)  Patient Location: PACU  Anesthesia Type:Spinal  Level of Consciousness: awake, alert , and oriented  Airway & Oxygen Therapy: Patient Spontanous Breathing  Post-op Assessment: Report given to RN and Post -op Vital signs reviewed and stable  Post vital signs: Reviewed and stable  Last Vitals:  Vitals Value Taken Time  BP 99/56 06/14/23 1504  Temp    Pulse 81 06/14/23 1507  Resp 16 06/14/23 1507  SpO2 97 % 06/14/23 1507  Vitals shown include unfiled device data.  Last Pain:  Vitals:   06/14/23 1135  TempSrc: Oral      Patients Stated Pain Goal: 5 (06/14/23 1135)  Complications: No notable events documented.

## 2023-06-14 NOTE — Op Note (Signed)
06/14/2023  3:40 PM  PATIENT:  Barbara Pham  32 y.o. female  PRE-OPERATIVE DIAGNOSIS:  Previous Cesarean Section, desires repeat  POST-OPERATIVE DIAGNOSIS:  Previous Cesarean Section, desires repeat  PROCEDURE:  Procedure(s) with comments: Repeat CESAREAN SECTION (N/A) - EDD 06/20/23 Low-transverse cesarean section with 2 layer closure  SURGEON:  Surgeons and Role:    * Noland Fordyce, MD - Primary  PHYSICIAN ASSISTANT:   ASSISTANTS: Arlan Organ, CNM  ANESTHESIA:   spinal  EBL:  100 mL   BLOOD ADMINISTERED:none  DRAINS: Urinary Catheter (Foley)   LOCAL MEDICATIONS USED:  NONE  SPECIMEN:  Source of Specimen:  Placenta to labor and delivery for disposal  DISPOSITION OF SPECIMEN:  N/A  COUNTS:  YES  TOURNIQUET:  * No tourniquets in log *  DICTATION: .Note written in EPIC  PLAN OF CARE: Admit to inpatient   PATIENT DISPOSITION:  PACU - hemodynamically stable.   Delay start of Pharmacological VTE agent (>24hrs) due to surgical blood loss or risk of bleeding: yes   Findings:  @BABYSEXEBC @ infant,  APGAR (1 MIN): 9  APGAR (5 MINS): 9  APGAR (10 MINS):   Normal uterus, tubes and ovaries, normal placenta. 3VC, clear amniotic fluid  EBL: Per nursing notes cc Antibiotics:   2g Ancef Complications: none  Indications: This is a 32 y.o. year-old, G2, P1 at [redacted]w[redacted]d admitted for repeat cesarean section. Risks benefits and alternatives of the procedure were discussed with the patient who agreed to proceed  Procedure:  After informed consent was obtained the patient was taken to the operating room where spinal anesthesia was initiated.  She was prepped and draped in the normal sterile fashion in dorsal supine position with a leftward tilt.  A foley catheter was in place.  A Pfannenstiel skin incision was made 2 cm above the pubic symphysis in the midline with the scalpel, over the old Pfannenstiel scar dissection was carried down with the Bovie cautery until the fascia was  reached. The fascia was incised in the midline. The incision was extended laterally with the Mayo scissors. The inferior aspect of the fascial incision was grasped with the Coker clamps, elevated up and the underlying rectus muscles were dissected off sharply. The superior aspect of the fascial incision was grasped with the Coker clamps elevated up and the underlying rectus muscles were dissected off sharply.  The peritoneum was entered bluntly. The peritoneal incision was extended superiorly and inferiorly with good visualization of the bladder.  A bladder flap was created sharply.  The bladder blade was inserted and palpation was done to assess the fetal position and the location of the uterine vessels. The lower segment of the uterus was incised sharply with the scalpel and extended  bluntly in the cephalo-caudal fashion. The infant was grasped, brought to the incision,  rotated and the infant was delivered with fundal pressure. The nose and mouth were bulb suctioned. The cord was clamped and cut after 30 seconds delay. The infant was handed off to the waiting pediatrician.  Cord blood was collected for donation.  The placenta was expressed. The uterus was exteriorized. The uterus was cleared of all clots and debris. The uterine incision was repaired with 0 Vicryl in a running locked fashion.  A second layer of the same suture was used in an imbricating fashion to obtain excellent hemostasis.  2 additional figure-of-eight sutures were placed in the left angle of the uterine incision for hemostasis.  The uterus was then returned to the abdomen, the  gutters were cleared of all clots and debris. The uterine incision was reinspected and found to be hemostatic. The peritoneum was grasped and closed with 2-0 Vicryl in a running fashion. The cut muscle edges and the underside of the fascia were inspected and found to be hemostatic. The fascia was closed with 0 Vicryl in a single layer. The subcutaneous tissue was  irrigated. Scarpa's layer was closed with a 2-0 plain gut suture. The skin was closed with a 4-0 Monocryl in a single layer. The patient tolerated the procedure well. Sponge lap and needle counts were correct x3 and patient was taken to the recovery room in a stable condition.  Lendon Colonel 06/14/2023 3:41 PM

## 2023-06-15 DIAGNOSIS — O9902 Anemia complicating childbirth: Secondary | ICD-10-CM | POA: Diagnosis not present

## 2023-06-15 LAB — CBC
HCT: 28.6 % — ABNORMAL LOW (ref 36.0–46.0)
Hemoglobin: 9.4 g/dL — ABNORMAL LOW (ref 12.0–15.0)
MCH: 30.8 pg (ref 26.0–34.0)
MCHC: 32.9 g/dL (ref 30.0–36.0)
MCV: 93.8 fL (ref 80.0–100.0)
Platelets: 206 10*3/uL (ref 150–400)
RBC: 3.05 MIL/uL — ABNORMAL LOW (ref 3.87–5.11)
RDW: 13.4 % (ref 11.5–15.5)
WBC: 16.3 10*3/uL — ABNORMAL HIGH (ref 4.0–10.5)
nRBC: 0 % (ref 0.0–0.2)

## 2023-06-15 LAB — BIRTH TISSUE RECOVERY COLLECTION (PLACENTA DONATION)

## 2023-06-15 MED ORDER — POLYSACCHARIDE IRON COMPLEX 150 MG PO CAPS
150.0000 mg | ORAL_CAPSULE | ORAL | Status: DC
  Administered 2023-06-15: 150 mg via ORAL
  Filled 2023-06-15 (×2): qty 1

## 2023-06-15 MED ORDER — MAGNESIUM OXIDE -MG SUPPLEMENT 400 (240 MG) MG PO TABS
400.0000 mg | ORAL_TABLET | Freq: Every day | ORAL | Status: DC
  Administered 2023-06-15 – 2023-06-16 (×2): 400 mg via ORAL
  Filled 2023-06-15 (×2): qty 1

## 2023-06-15 NOTE — Progress Notes (Signed)
MOB was referred for history of depression/anxiety.  * Referral screened out by Clinical Social Worker because none of the following criteria appear to apply:  ~ History of anxiety/depression during this pregnancy, or of post-partum depression following prior delivery.  ~ Diagnosis of anxiety and/or depression within last 3 years  OR  * MOB's symptoms currently being treated with medication and/or therapy.  Per OB notes, MOB is prescribed Wellbutrin 150mg  and Buspar 7.5mg  for support of symptoms.  Please contact the Clinical Social Worker if needs arise, by [facility] request, or if MOB scores greater than 9/yes to question 10 on Edinburgh Postpartum Depression Screen.  Enos Fling, Theresia Majors Clinical Social Worker [phone]

## 2023-06-15 NOTE — Lactation Note (Signed)
This note was copied from a baby's chart. Lactation Consultation Note Mom had baby BF when LC came into rm. Mom stated baby had slept a lot since delivery and during the night. Mom is happy that the baby is latched and BF so well. Mom stated it was hurting some. LC brought baby closer to breast. Encouraged cheeks to breast. Mom stated that made a difference. Baby's head was bobbing up and down but when brought closer it lessened and saw more jaw extension. Newborn feeding habits, STS, behavior, I&O, positioning, support, props reviewed. Mom encouraged to feed baby 8-12 times/24 hours and with feeding cues.  Discussed when to pump if needed. Encouraged mom to call for assistance or questions. Praised mom for a good job.  Patient Name: Barbara Pham [id number] Date: 06/15/2023 Age:32 hours Reason for consult: Initial assessment;Term   Maternal Data Has patient been taught Hand Expression?: Yes Does the patient have breastfeeding experience prior to this delivery?: Yes How long did the patient breastfeed?: pumped for 3 months d/t baby in NICU to her now 68 yr old  Feeding    LATCH Score Latch: Grasps breast easily, tongue down, lips flanged, rhythmical sucking.  Audible Swallowing: A few with stimulation  Type of Nipple: Everted at rest and after stimulation (short shaft compressible)  Comfort (Breast/Nipple): Soft / non-tender  Hold (Positioning): No assistance needed to correctly position infant at breast.  LATCH Score: 9   Lactation Tools Discussed/Used    Interventions Interventions: Breast feeding basics reviewed;Adjust position;Assisted with latch;Support pillows;Skin to skin;Position options;Education;Breast compression;LC Services brochure;Hand express  Discharge    Consult Status Consult Status: Follow-up Date: 06/16/23 Follow-up type: In-patient    Charyl Dancer 06/15/2023, 6:55 AM

## 2023-06-15 NOTE — Progress Notes (Signed)
      Subjective: POD# 1 Live born female  Birth Weight: 6 lb 11.6 oz (3050 g) APGAR: 9, 9  Newborn Delivery   Birth date/time: 06/[birth year]/2023 14:14:00 Delivery type: C-Section, Low Transverse Trial of labor: No C-section categorization: Repeat    Baby name: Pamelia Hoit Delivering provider: Noland Fordyce  circumcision planned Feeding: breast  Pain control at delivery: Spinal  Reports feeling well, some soreness at incision site.  Patient reports tolerating PO.   Breast symptoms:+ colostrum Pain controlled with ibuprofen (OTC) and Toradol Denies HA/SOB/C/P/N/V/dizziness. Flatus present. She reports vaginal bleeding as normal, without clots.  She is ambulating, urinating without difficulty.     Objective:   VS:    Vitals:   06/14/23 2003 06/14/23 2133 06/15/23 0219 06/15/23 0620  BP: 111/74 (!) 95/57 102/66 (!) 99/58  Pulse: 90 82 70 86  Resp: 18 18 18 16   Temp:  97.7 F (36.5 C) 98.2 F (36.8 C) 97.7 F (36.5 C)  TempSrc:  Oral Oral Oral  SpO2: 98% 98% 99% 99%  Weight:      Height:         Intake/Output Summary (Last 24 hours) at 06/15/2023 0851 Last data filed at 06/15/2023 0200 Gross per 24 hour  Intake 1100 ml  Output 2200 ml  Net -1100 ml        Recent Labs    06/13/23 0934 06/15/23 0659  WBC 7.9 16.3*  HGB 10.8* 9.4*  HCT 33.7* 28.6*  PLT 183 206     Blood type: --/--/O POS (07/16 0933)  Rubella: Immune (01/09 0000)    Physical Exam:  General: alert, cooperative, and no distress CV: Regular rate and rhythm Resp: clear Abdomen: soft, nontender, normal bowel sounds Incision: clean, dry, and intact Uterine Fundus: firm, below umbilicus, nontender Lochia: minimal Ext: no edema, redness or tenderness in the calves or thighs  Assessment/Plan: 32 y.o.   POD# 1. [id number]                  Principal Problem:   Postpartum care following cesarean delivery 7/17 Active Problems:   Previous cesarean section   Maternal anemia, with delivery - ABL  -  asymptomatic  - started oral Fe and Mag ox  Doing well, stable.               Advance diet as tolerated Encourage rest when baby rests Breastfeeding support Encourage to ambulate Routine post-op care  Neta Mends, CNM, MSN 06/15/2023, 8:51 AM

## 2023-06-16 MED ORDER — MAGNESIUM OXIDE -MG SUPPLEMENT 400 (240 MG) MG PO TABS: 400.0000 mg | ORAL_TABLET | Freq: Every day | ORAL | Status: AC

## 2023-06-16 MED ORDER — COCONUT OIL OIL: 1.0000 | TOPICAL_OIL | Status: AC | PRN

## 2023-06-16 MED ORDER — IBUPROFEN 600 MG PO TABS: 600.0000 mg | ORAL_TABLET | Freq: Four times a day (QID) | ORAL | 0 refills | Status: AC

## 2023-06-16 MED ORDER — ACETAMINOPHEN 500 MG PO TABS: 1000.0000 mg | ORAL_TABLET | Freq: Four times a day (QID) | ORAL | 0 refills | Status: AC

## 2023-06-16 MED ORDER — SENNOSIDES-DOCUSATE SODIUM 8.6-50 MG PO TABS: 2.0000 | ORAL_TABLET | ORAL | Status: AC

## 2023-06-16 MED ORDER — OXYCODONE HCL 5 MG PO TABS: 5.0000 mg | ORAL_TABLET | Freq: Four times a day (QID) | ORAL | 0 refills | Status: AC | PRN

## 2023-06-16 MED ORDER — SIMETHICONE 80 MG PO CHEW: 80.0000 mg | CHEWABLE_TABLET | Freq: Three times a day (TID) | ORAL | 0 refills | Status: AC

## 2023-06-16 MED ORDER — POLYSACCHARIDE IRON COMPLEX 150 MG PO CAPS: 150.0000 mg | ORAL_CAPSULE | Freq: Every day | ORAL | Status: AC

## 2023-06-16 NOTE — Discharge Summary (Signed)
OB Discharge Summary  Patient Name: Barbara Pham DOB: [birth year] MRN: [id number]  Date of admission: 06/14/2023 Delivering provider: Noland Fordyce  Admitting diagnosis: Previous cesarean section [Z98.891] Intrauterine pregnancy: [redacted]w[redacted]d     Secondary diagnosis: Patient Active Problem List   Diagnosis Date Noted   Maternal anemia, with delivery - ABL 06/15/2023   Previous cesarean section 06/14/2023   Postpartum care following cesarean delivery 7/17 06/29/2016   Severe preeclampsia 06/28/2016   Additional problems:none   Date of discharge: 06/16/2023   Discharge diagnosis: Principal Problem:   Postpartum care following cesarean delivery 7/17 Active Problems:   Previous cesarean section   Maternal anemia, with delivery - ABL                                                              Post partum procedures: none  Augmentation: N/A Pain control: Spinal Laceration:None Episiotomy:None Complications: None  Hospital course:  Sceduled C/S   32 y.o. yo G2P1102 at [redacted]w[redacted]d was admitted to the hospital 06/14/2023 for scheduled cesarean section with the following indication:Elective Repeat.Delivery details are as follows:  Membrane Rupture Time/Date: 2:13 PM,06/14/2023  Delivery Method:C-Section, Low Transverse Details of operation can be found in separate operative note.  Patient had a postpartum course complicated by none.  She is ambulating, tolerating a regular diet, passing flatus, and urinating well. Patient is discharged home in stable condition on  06/16/23        Newborn Data: Birth date:06/14/2023 Birth time:2:14 PM Gender:Female Living status:Living Apgars:9 ,9  Weight:3050 g    Physical exam  Vitals:   06/15/23 1028 06/15/23 1428 06/15/23 2110 06/16/23 0515  BP: 97/63 104/69 111/60 115/71  Pulse: 77 74 80 88  Resp: 18 18 17 18   Temp: 98.2 F (36.8 C) 98.3 F (36.8 C) 98.4 F (36.9 C)   TempSrc: Oral Oral Oral   SpO2: 100% 99% 99% 100%  Weight:       Height:       General: alert, cooperative, and no distress Lochia: appropriate Uterine Fundus: firm Incision: Dressing is clean, dry, and intact DVT Evaluation: No cords or calf tenderness. No significant calf/ankle edema. Labs: Lab Results  Component Value Date   WBC 16.3 (H) 06/15/2023   HGB 9.4 (L) 06/15/2023   HCT 28.6 (L) 06/15/2023   MCV 93.8 06/15/2023   PLT 206 06/15/2023      Latest Ref Rng & Units 11/03/2020   11:30 AM  CMP  Glucose 70 - 99 mg/dL 062   BUN 6 - 20 mg/dL 16   Creatinine 3.76 - 1.00 mg/dL 2.83   Sodium 151 - 761 mmol/L 136   Potassium 3.5 - 5.1 mmol/L 3.3   Chloride 98 - 111 mmol/L 104   CO2 22 - 32 mmol/L 24   Calcium 8.9 - 10.3 mg/dL 8.7   Total Protein 6.5 - 8.1 g/dL 6.9   Total Bilirubin 0.3 - 1.2 mg/dL 0.5   Alkaline Phos 38 - 126 U/L 38   AST 15 - 41 U/L 15   ALT 0 - 44 U/L 13       06/15/2023   10:28 AM  Edinburgh Postnatal Depression Scale Screening Tool  I have been able to laugh and see the funny side of things. 0  I  have looked forward with enjoyment to things. 0  I have blamed myself unnecessarily when things went wrong. 1  I have been anxious or worried for no good reason. 2  I have felt scared or panicky for no good reason. 0  Things have been getting on top of me. 1  I have been so unhappy that I have had difficulty sleeping. 0  I have felt sad or miserable. 1  I have been so unhappy that I have been crying. 1  The thought of harming myself has occurred to me. 0  Edinburgh Postnatal Depression Scale Total 6   Discharge instruction:  per After Visit Summary,  [facility] OB booklet and  "Understanding Mother & Baby Care" hospital booklet After Visit Meds:  Allergies as of 06/16/2023   No Known Allergies      Medication List     STOP taking these medications    aspirin EC 81 MG tablet       TAKE these medications    acetaminophen 500 MG tablet Commonly known as: TYLENOL Take 2 tablets (1,000 mg total) by mouth  every 6 (six) hours.   buPROPion 150 MG 12 hr tablet Commonly known as: WELLBUTRIN SR Take 150 mg by mouth 2 (two) times daily.   busPIRone 7.5 MG tablet Commonly known as: BUSPAR Take 7.5 mg by mouth 2 (two) times daily.   coconut oil Oil Apply 1 Application topically as needed.   famotidine 20 MG tablet Commonly known as: PEPCID Take 20 mg by mouth every evening.   ibuprofen 600 MG tablet Commonly known as: ADVIL Take 1 tablet (600 mg total) by mouth every 6 (six) hours.   iron polysaccharides 150 MG capsule Commonly known as: Ferrex 150 Take 1 capsule (150 mg total) by mouth daily.   magnesium oxide 400 (240 Mg) MG tablet Commonly known as: MAG-OX Take 1 tablet (400 mg total) by mouth daily. For prevention of constipation.   oxyCODONE 5 MG immediate release tablet Commonly known as: Oxy IR/ROXICODONE Take 1 tablet (5 mg total) by mouth every 6 (six) hours as needed for up to 5 days for moderate pain.   prenatal multivitamin Tabs tablet Take 1 tablet by mouth at bedtime.   senna-docusate 8.6-50 MG tablet Commonly known as: Senokot-S Take 2 tablets by mouth daily.   simethicone 80 MG chewable tablet Commonly known as: MYLICON Chew 1 tablet (80 mg total) by mouth 3 (three) times daily after meals.       Diet: iron rich diet Activity: Advance as tolerated. Pelvic rest for 6 weeks.  Postpartum contraception: TBA in office Newborn Data: Live born female  Birth Weight: 6 lb 11.6 oz (3050 g) APGAR: 9, 9  Newborn Delivery   Birth date/time: 06/14/2023 14:14:00 Delivery type: C-Section, Low Transverse Trial of labor: No C-section categorization: Repeat     named Pamelia Hoit Baby Feeding: Breast Disposition:home with mother Circumcision: completed / Renae Fickle, CNM Delivery Report: Review the Delivery Report for details.   Follow up:  Follow-up Information     Noland Fordyce, MD. Schedule an appointment as soon as possible for a visit in 6 week(s).   Specialty:  Obstetrics and Gynecology Why: For Postpartum follow-up Contact information: [address] [place] [address] [phone]                Signed: Neta Mends, CNM, MSN 06/16/2023, 11:18 AM

## 2023-06-16 NOTE — Discharge Instructions (Signed)
Lactation outpatient support - home visit   Jessica Bowers, IBCLC (lactation consultant)  & Birth Doula  Phone (text or call): [phone] Email: [email or web address]   Linda Coppola RN, MHA, IBCLC at [facility]: Lactation Consultant  [email or web address] Mail: [email or web address] Tel: [phone]   Additional breastfeeding resources:  [facility] [email or web address]://ibconline.ca/information-[email or web address]  [employer] [place]  www.[email or web address]   Other Resources:  Chiropractic specialist   Dr. Leanna Hastings [email or web address]   Craniosacral therapy for baby  Erin Balkind  [email or web address]  Pediatric Dentist (for tongue ties)  Dr. Kate Lambert Spangler, [facility]  Phone: [phone]  [address] [place]  [address] [email or web address]  

## 2023-06-16 NOTE — Lactation Note (Signed)
This note was copied from a baby's chart. Lactation Consultation Note  Patient Name: Barbara Pham [id number] Date: 06/16/2023 Age:32 hours  Reason for consult: Follow-up assessment  P2, term, 6% weight loss  Mother pleased with how breastfeeding is progressing. This is her first time to breastfeed due to her other child being born at 72 weeks. She said her milk did not come in with her first child but she thinks she was too stressed at the time.   She reports baby cluster fed during the night and she has started giving him 10-30 ml of formula by bottle. Discussed supply and demand, OP Lactation support and services for further help with breastfeeding and feeding baby with cues 8-12 plus times in 24 hours to promote and establish milk supply.  Mother has a breast pump that she can use if needed for additional stimulation.    Feeding Mother's Current Feeding Choice: Breast Milk and Formula  Interventions Interventions: Education  Discharge Discharge Education: Engorgement and breast care;Warning signs for feeding baby Pump: DEBP;Personal  Consult Status Consult Status: Complete Date: 06/16/23    Omar Person 06/16/2023, 1:22 PM

## 2023-06-16 NOTE — Lactation Note (Signed)
This note was copied from a baby's chart. Lactation Consultation Note  Patient Name: Barbara Pham [id number] Date: 06/16/2023 Age:32 hours Reason for consult: Difficult latch;Mother's request;Breastfeeding assistance;1st time breastfeeding P2, term female infant with -3.28% weight loss. Infant was fussy when [facility] entered room, LC suggested Birth Parent hand express and Birth Parent gave infant 3 mls of colostrum by spoon prior to latching infant at the breast, infant appeared calmer and started cuing to feed. Birth Parent has few mild abrasions on breast due infant having shallow latch, LC assisted Birth Parent with pillow support, having infant in alignment breast to chest positioning, infant latched on Birth Parent's left breast using the cross cradle hold, infant sustained latch, swallows heard and was still breastfeeding after 12 minutes when LC left the room. Birth Parent will continue to work on latching infant with depth, knows if she feels pain, pinching or discomfort to break latch and re-latch infant at the breast. Birth Parent knows to call RN.LC for further latch assistance if needed.  Birth Parent will continue to BF infant by cues, on demand, 8 to 12+ times within 24 hours, skin to skin. LC reinforced importance of maternal rest, diet and hydration.   Maternal Data    Feeding Mother's Current Feeding Choice: Breast Milk  LATCH Score Latch: Grasps breast easily, tongue down, lips flanged, rhythmical sucking.  Audible Swallowing: Spontaneous and intermittent  Type of Nipple: Everted at rest and after stimulation  Comfort (Breast/Nipple): Filling, red/small blisters or bruises, mild/mod discomfort  Hold (Positioning): Assistance needed to correctly position infant at breast and maintain latch.  LATCH Score: 8   Lactation Tools Discussed/Used    Interventions Interventions: Support pillows;Position options;Expressed milk;Skin to skin;Assisted with latch;Adjust  position;Education;Breast compression;Breast massage  Discharge    Consult Status Consult Status: Follow-up Date: 06/17/23 Follow-up type: In-patient    Frederico Hamman 06/16/2023, 1:44 AM

## 2023-07-11 ENCOUNTER — Telehealth (HOSPITAL_COMMUNITY): Payer: Self-pay | Admitting: *Deleted

## 2023-07-11 NOTE — Telephone Encounter (Signed)
07/11/2023  Name: Barbara Pham MRN: [id number] DOB: 10/15/1991  Reason for Call:  Transition of Care Hospital Discharge Call  Contact Status: Patient Contact Status: Message  Language assistant needed: Interpreter Mode: Interpreter Not Needed        Follow-Up Questions:    Inocente Salles Postnatal Depression Scale:  In the Past 7 Days:    PHQ2-9 Depression Scale:     Discharge Follow-up:    Post-discharge interventions: NA  Salena Saner, RN 07/11/2023 15:53
